# Patient Record
Sex: Female | Born: 1975 | Race: White | Hispanic: No | Marital: Married | Smoking: Former smoker
Health system: Southern US, Community
[De-identification: ages and names within clinical notes are randomized; demographics above are authoritative.]

## PROBLEM LIST (undated history)

## (undated) DIAGNOSIS — G43909 Migraine, unspecified, not intractable, without status migrainosus: Secondary | ICD-10-CM

## (undated) DIAGNOSIS — F53 Postpartum depression: Secondary | ICD-10-CM

## (undated) DIAGNOSIS — O99345 Other mental disorders complicating the puerperium: Secondary | ICD-10-CM

## (undated) DIAGNOSIS — N469 Male infertility, unspecified: Secondary | ICD-10-CM

## (undated) HISTORY — DX: Other mental disorders complicating the puerperium: O99.345

## (undated) HISTORY — PX: OTHER SURGICAL HISTORY: SHX169

## (undated) HISTORY — DX: Migraine, unspecified, not intractable, without status migrainosus: G43.909

## (undated) HISTORY — DX: Male infertility, unspecified: N46.9

## (undated) HISTORY — DX: Postpartum depression: F53.0

## (undated) HISTORY — PX: SKIN CANCER EXCISION: SHX779

---

## 1998-04-07 ENCOUNTER — Inpatient Hospital Stay (HOSPITAL_COMMUNITY): Admission: AD | Admit: 1998-04-07 | Discharge: 1998-04-07 | Payer: Self-pay | Admitting: Obstetrics

## 2001-11-18 ENCOUNTER — Emergency Department (HOSPITAL_COMMUNITY): Admission: EM | Admit: 2001-11-18 | Discharge: 2001-11-18 | Payer: Self-pay

## 2002-08-29 ENCOUNTER — Other Ambulatory Visit: Admission: RE | Admit: 2002-08-29 | Discharge: 2002-08-29 | Payer: Self-pay | Admitting: Obstetrics and Gynecology

## 2002-11-22 ENCOUNTER — Encounter: Payer: Self-pay | Admitting: Emergency Medicine

## 2002-11-22 ENCOUNTER — Emergency Department (HOSPITAL_COMMUNITY): Admission: EM | Admit: 2002-11-22 | Discharge: 2002-11-22 | Payer: Self-pay | Admitting: Emergency Medicine

## 2003-10-22 ENCOUNTER — Other Ambulatory Visit: Admission: RE | Admit: 2003-10-22 | Discharge: 2003-10-22 | Payer: Self-pay | Admitting: Obstetrics and Gynecology

## 2004-03-02 ENCOUNTER — Ambulatory Visit (HOSPITAL_COMMUNITY): Admission: RE | Admit: 2004-03-02 | Discharge: 2004-03-02 | Payer: Self-pay | Admitting: Obstetrics and Gynecology

## 2004-04-03 ENCOUNTER — Inpatient Hospital Stay (HOSPITAL_COMMUNITY): Admission: AD | Admit: 2004-04-03 | Discharge: 2004-04-03 | Payer: Self-pay | Admitting: Obstetrics and Gynecology

## 2004-05-26 ENCOUNTER — Ambulatory Visit: Payer: Self-pay | Admitting: Family Medicine

## 2004-09-07 ENCOUNTER — Ambulatory Visit: Payer: Self-pay | Admitting: Family Medicine

## 2004-10-19 ENCOUNTER — Ambulatory Visit: Payer: Self-pay | Admitting: Family Medicine

## 2004-11-07 ENCOUNTER — Ambulatory Visit: Payer: Self-pay | Admitting: Family Medicine

## 2005-06-13 ENCOUNTER — Encounter: Admission: RE | Admit: 2005-06-13 | Discharge: 2005-07-16 | Payer: Self-pay | Admitting: Obstetrics and Gynecology

## 2005-08-14 ENCOUNTER — Inpatient Hospital Stay (HOSPITAL_COMMUNITY): Admission: AD | Admit: 2005-08-14 | Discharge: 2005-08-17 | Payer: Self-pay | Admitting: Obstetrics and Gynecology

## 2005-09-18 ENCOUNTER — Ambulatory Visit: Payer: Self-pay | Admitting: Family Medicine

## 2005-10-17 ENCOUNTER — Ambulatory Visit: Payer: Self-pay | Admitting: Internal Medicine

## 2006-02-16 ENCOUNTER — Ambulatory Visit: Payer: Self-pay | Admitting: Family Medicine

## 2006-06-11 ENCOUNTER — Ambulatory Visit: Payer: Self-pay | Admitting: Family Medicine

## 2006-07-30 ENCOUNTER — Ambulatory Visit: Payer: Self-pay | Admitting: Family Medicine

## 2006-08-23 ENCOUNTER — Ambulatory Visit: Payer: Self-pay | Admitting: Family Medicine

## 2006-08-23 LAB — CONVERTED CEMR LAB
Basophils Absolute: 0.1 10*3/uL (ref 0.0–0.1)
Basophils Relative: 1.1 % — ABNORMAL HIGH (ref 0.0–1.0)
Cholesterol: 234 mg/dL (ref 0–200)
Direct LDL: 173.4 mg/dL
Glucose, Bld: 82 mg/dL (ref 70–99)
HDL: 63.8 mg/dL (ref >39.0)
Hemoglobin: 13.9 g/dL (ref 12.0–15.0)
Monocytes Relative: 4 % (ref 3.0–11.0)
Platelets: 299 10*3/uL (ref 150–400)
RBC: 4.58 M/uL (ref 3.87–5.11)
RDW: 12.5 % (ref 11.5–14.6)
TSH: 1.72 microintl units/mL (ref 0.35–5.50)
Total CHOL/HDL Ratio: 3.7
Triglycerides: 81 mg/dL (ref 0–149)
VLDL: 16 mg/dL (ref 0–40)

## 2006-11-14 ENCOUNTER — Ambulatory Visit: Payer: Self-pay | Admitting: Family Medicine

## 2007-04-22 ENCOUNTER — Ambulatory Visit: Payer: Self-pay | Admitting: Internal Medicine

## 2007-04-22 DIAGNOSIS — L2089 Other atopic dermatitis: Secondary | ICD-10-CM

## 2007-04-22 DIAGNOSIS — M75100 Unspecified rotator cuff tear or rupture of unspecified shoulder, not specified as traumatic: Secondary | ICD-10-CM

## 2007-12-18 ENCOUNTER — Ambulatory Visit (HOSPITAL_COMMUNITY): Admission: RE | Admit: 2007-12-18 | Discharge: 2007-12-18 | Payer: Self-pay | Admitting: Obstetrics and Gynecology

## 2007-12-19 ENCOUNTER — Inpatient Hospital Stay (HOSPITAL_COMMUNITY): Admission: AD | Admit: 2007-12-19 | Discharge: 2007-12-19 | Payer: Self-pay | Admitting: Obstetrics & Gynecology

## 2007-12-20 ENCOUNTER — Inpatient Hospital Stay (HOSPITAL_COMMUNITY): Admission: AD | Admit: 2007-12-20 | Discharge: 2007-12-20 | Payer: Self-pay | Admitting: Obstetrics & Gynecology

## 2007-12-20 ENCOUNTER — Encounter: Payer: Self-pay | Admitting: Obstetrics and Gynecology

## 2007-12-20 ENCOUNTER — Observation Stay (HOSPITAL_COMMUNITY): Admission: AD | Admit: 2007-12-20 | Discharge: 2007-12-21 | Payer: Self-pay | Admitting: Obstetrics and Gynecology

## 2007-12-24 ENCOUNTER — Encounter: Payer: Self-pay | Admitting: Obstetrics and Gynecology

## 2007-12-24 ENCOUNTER — Encounter (INDEPENDENT_AMBULATORY_CARE_PROVIDER_SITE_OTHER): Payer: Self-pay | Admitting: Obstetrics and Gynecology

## 2007-12-24 ENCOUNTER — Inpatient Hospital Stay (HOSPITAL_COMMUNITY): Admission: AD | Admit: 2007-12-24 | Discharge: 2007-12-30 | Payer: Self-pay | Admitting: Obstetrics and Gynecology

## 2008-01-10 ENCOUNTER — Inpatient Hospital Stay (HOSPITAL_COMMUNITY): Admission: AD | Admit: 2008-01-10 | Discharge: 2008-01-11 | Payer: Self-pay | Admitting: *Deleted

## 2008-05-27 ENCOUNTER — Ambulatory Visit: Payer: Self-pay | Admitting: Family Medicine

## 2008-08-07 ENCOUNTER — Ambulatory Visit: Payer: Self-pay | Admitting: Family Medicine

## 2008-08-07 DIAGNOSIS — IMO0002 Reserved for concepts with insufficient information to code with codable children: Secondary | ICD-10-CM

## 2008-11-30 ENCOUNTER — Ambulatory Visit: Payer: Self-pay | Admitting: Family Medicine

## 2008-11-30 DIAGNOSIS — M25519 Pain in unspecified shoulder: Secondary | ICD-10-CM | POA: Insufficient documentation

## 2008-11-30 DIAGNOSIS — M2550 Pain in unspecified joint: Secondary | ICD-10-CM | POA: Insufficient documentation

## 2008-12-18 ENCOUNTER — Ambulatory Visit: Payer: Self-pay | Admitting: Family Medicine

## 2008-12-18 DIAGNOSIS — M654 Radial styloid tenosynovitis [de Quervain]: Secondary | ICD-10-CM

## 2009-01-27 ENCOUNTER — Emergency Department (HOSPITAL_COMMUNITY): Admission: EM | Admit: 2009-01-27 | Discharge: 2009-01-27 | Payer: Self-pay | Admitting: Emergency Medicine

## 2009-04-14 ENCOUNTER — Ambulatory Visit: Payer: Self-pay | Admitting: Family Medicine

## 2009-04-25 ENCOUNTER — Emergency Department (HOSPITAL_COMMUNITY): Admission: EM | Admit: 2009-04-25 | Discharge: 2009-04-25 | Payer: Self-pay | Admitting: Emergency Medicine

## 2010-02-25 IMAGING — CR DG ANKLE COMPLETE 3+V*R*
3 series · 3 of 3 positions shown · non-contrast
Comparison: None

CLINICAL DATA: Ankle injury and pain.

RIGHT ANKLE - COMPLETE 3+ VIEW

[t ankle joint ap right]
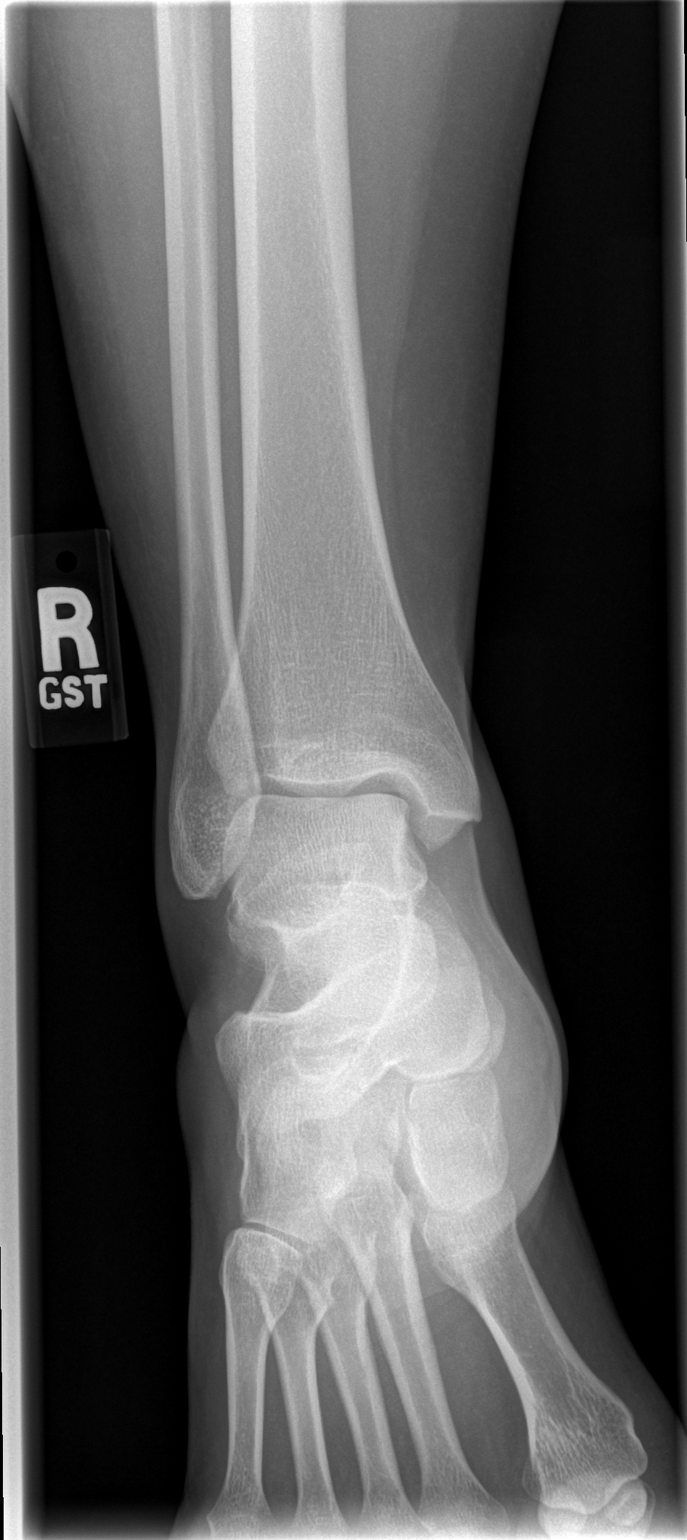

[t ankle joint oblique right]
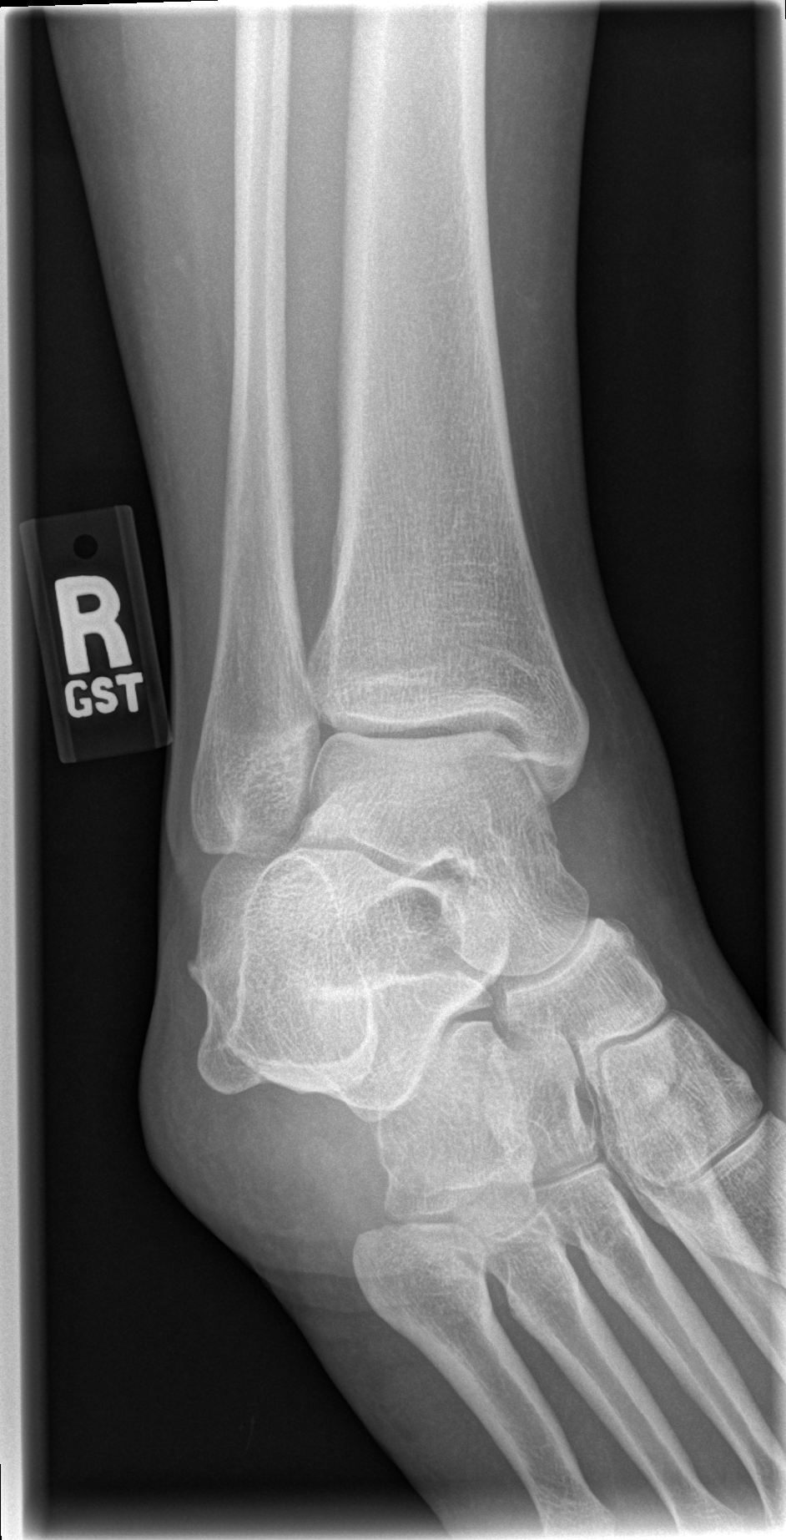

[t ankle joint lat right]
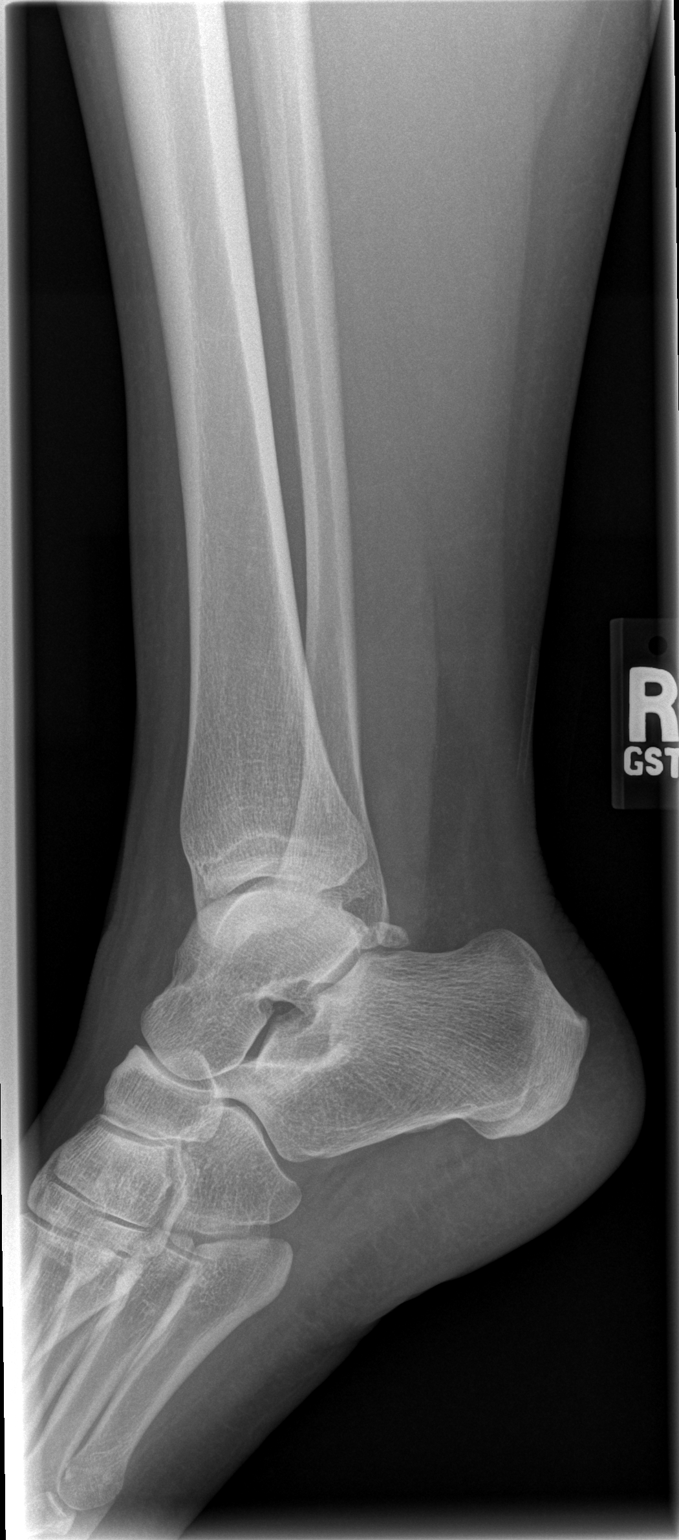

[3 of 3 positions shown; findings below may reference images not displayed]

FINDINGS: There is no evidence of fracture, dislocation or joint
effusion.  There is no evidence of arthropathy or other focal bone
abnormality.  Soft tissues are unremarkable.
IMPRESSION: Negative.

## 2010-08-07 ENCOUNTER — Encounter: Payer: Self-pay | Admitting: Obstetrics and Gynecology

## 2010-11-29 NOTE — Op Note (Signed)
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   PROCEDURE:  Primary low segment transverse cesarean section.   SURGEON:  Lenoard Aden, MD   ANESTHESIA:  Spinal by Malen Gauze.   ESTIMATED BLOOD LOSS:  1200 mL.   COMPLICATIONS:  None.   DRAINS:  Foley.   COUNTS:  Correct.   The patient to recovery in good condition.   FINDINGS:  A delivered as a female, B boy, C female, all babies to the  NICU.  Placenta to pathology, delivered manually, 2-layer uterine  closure.  Normal tubes and normal ovaries.   BRIEF OPERATIVE NOTE:  Being apprised of risks of anesthesia, infection,  bleeding, injury to abdominal organs, need for repair, delayed versus  immediate complications to include bowel and bladder injury, the patient  was brought to the operating room where she was administered spinal  anesthetic without complications, prepped and draped in usual sterile  fashion.  Foley catheter was placed.  After achieving adequate  anesthesia, dilute Marcaine solution placed.  Pfannenstiel skin incision  made with a scalpel, carried down to the fascia, which was nicked in the  midline and then transversely using Mayo scissors.  Rectus muscle was  dissected sharply in the midline.  Peritoneum entered sharply.  Bladder  blade placed.  Visceral peritoneum scoured sharply off the low-uterine  segment.  Kerr hysterotomy incision made.  Amniotomy clear fluid.  Baby  A  delivered from the maternal left footling breech position, handed to  pediatricians in attendance.  Apgars pending.  Amniotomy clear fluid.  Baby B is delivered from vertex position from the maternal right as a  boy, handed to pediatricians in attendance.  Apgars pending.  C  delivered also as a vertex amniotomy clear fluid, handed to  pediatricians in attendance.  Apgars pending.  Cord blood collected.  Placenta delivered manually, intact 3-vessel cord in all 3 cords.  Uterus was exteriorized, curetted using a dry lap pack and closed in 2  running imbricating layers of 0 Monocryl suture, multiple interrupted  sutures placed in the midline for hemostasis.  Irrigation accomplished.  Bladder flap inspected and found to be hemostatic.  Peritoneum closed using a 2-0 plain in a continuous running fashion.  Fascia closed using 0 Monocryl in a running fashion.  Subcutaneous  tissue reapproximated using a 2-0 plain in a continuous running fashion.  Skin closed using staples.  The patient tolerated the procedure well,  was transferred to recovery in good condition.Lenoard Aden, M.D.  Electronically Signed     RJT/MEDQ  D:  12/27/2007  T:  12/28/2007  Job:  [id number]

## 2010-11-29 NOTE — H&P (Signed)
Sherry Zamora, Sherry Zamora                ACCOUNT NO.:  [id number]   MEDICAL RECORD NO.:  [id number]          PATIENT TYPE:  INP   LOCATION:  9171                          FACILITY:  [facility]   PHYSICIAN:  Lenoard Aden, M.D.DATE OF BIRTH:  Dec 23, 1975   DATE OF ADMISSION:  12/24/2007  DATE OF DISCHARGE:                              HISTORY & PHYSICAL   CHIEF COMPLAINT:  Possible preeclampsia.   She is a 35 year old white female G2, P1 at 27-1/7 weeks with a triplet  gestation who is now status post amnio-reduction for twin-to-twin  transfusion sequence performed 5 days ago successfully.  Was seen in  maternal/fetal medicine for followup today with elevated blood pressures  and a 10-pound weight gain in a 5-day period.   MEDICATIONS:  Prenatal vitamin.   ALLERGIES:  HYDROCORTISONE.   PERSONAL HISTORY:  Of skin cancer, basal cell, and migraine headaches,  gestational diabetes with her first pregnancy, has passed her 1-hour  Glucola test done earlier this pregnancy.   She is a nonsmoker, nondrinker.  Denies domestic or physical violence.   FAMILY HISTORY:  Migraines, heart disease, bronchitis, lupus, CHF,  diabetes, and kidney disease.   PRENATAL HISTORY:  Remarkable for an uncomplicated vaginal delivery at  38 weeks in 2007.  Prenatal course to date has been notable for a  triplet pregnancy with an early twin-to-twin transfusion sequence of A  to B status post amnio-reduction as noted.  Preterm labor with no  evidence of cervical change.   PHYSICAL EXAM:  Blood pressure is ranging from 140-150 over 90-97.  HEENT:  Normal.  LUNGS:  Clear.  HEART:  Regular rate and rhythm.  ABDOMEN:  Soft, gravid, and nontender.  No guarding, rebound, or  tenderness noted.  DTRs 2+ with 2+ pretibial edema.  No clonus is noted.  PELVIC EXAM:  Cervix is flared, 3 cm long presenting part of the pelvis.  NEUROLOGIC:  Nonfocal.   NSTs are reassuring x3 for 27 weeks.  No decels noted, 10 x 10 accels  noted, occasionally irregular contractions appreciated.   LABS:  Reveal a uric acid of 7.9, a CBC notable of a platelet count of  110,000. LDH of 211, SGOT and PT of 32 and 20 respectively.  The patient  is betamethasone complete.   IMPRESSION:  1. A 27-1/7 week triplet gestation.  2. 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  3. New-onset probable preeclampsia.   PLAN:  Admit.  Serial monitoring.  Serial labs.  Bed rest.  Bathroom  privilege.  Strict I&Os and start 24 hour urine.      Lenoard Aden, M.D.  Electronically Signed     RJT/MEDQ  D:  12/24/2007  T:  12/24/2007  Job:  [id number]

## 2010-11-29 NOTE — H&P (Signed)
NAME:  Sherry Zamora, Sherry Zamora                ACCOUNT NO.:  [id number]   MEDICAL RECORD NO.:  [id number]          PATIENT TYPE:  OBV   LOCATION:  9172                          FACILITY:  [facility]   PHYSICIAN:  Lenoard Aden, M.D.DATE OF BIRTH:  06-25-1976   DATE OF ADMISSION:  12/20/2007  DATE OF DISCHARGE:                              HISTORY & PHYSICAL   .   CHIEF COMPLAINT:  Preterm labor.   She is a 32-year white female G2, P1 at 83 and 4/[redacted] weeks gestation,  status post IVF with known triplet gestation with early, stage twin-to-  twin transfusion syndrome, status post amnio-reduction by Maternal Fetal  Medicine, Dr. Eustaquio Boyden, today, uncomplicated at 1500 cc.  She was  admitted now for fetal monitoring and is noted to be contracting every 2-  4 minutes, and slightly uncomfortable upon presentation for further  care.   She is has no known drug allergies.   MEDICATIONS:  Prenatal vitamins.   She is a nonsmoker, nondrinker.  She denies domestic or physical  violence.  She has a personal history of migraine headaches, gestational  diabetes with her first pregnancy, with normal 1-hour GTT this pregnancy  and a history of reformed tobacco use.   FAMILY HISTORY:  Lupus, chronic bronchitis, CHF, kidney stones,  scleroderma, migraines, diabetes, rheumatoid arthritis, kidney disease  and myocardial infarction.   Prenatal course to date has been uncomplicated up to development of new  onset twin-to-twin transfusion as of Dec 11, 2007, status post a  consultation with MFM this past week.   PHYSICAL EXAMINATION:  She is a well-developed, well-nourished white  female in no acute distress.  Blood pressure is 140s/80s to 90.  HEENT:  Normal.  NECK:  Supple, full range of motion.  LUNGS:  Clear.  HEART:  Regular.  ABDOMEN:  Soft, gravid, nontender.  No CVA tenderness.  Cervix is closed, but flared, 3 cm long.  Presenting part is at -2.  EXTREMITIES:  No cords.  DTRs 2+ and some clonus.  NEUROLOGIC:  Intact.  Fetal heart tones are noted, A, B and C, as  documented, contractions every 2-4 minutes, now spacing out status post  Procardia.   IMPRESSION:  1. A 26 and 4/7 week triplet gestation.  2. Early twin-to-twin transfusions of A to B with A being the twin      with polyhydramnios and B with oligohydramnios.  3. Preterm labor.   PLAN:  Tocolytics.  Will give Procardia 10 mg p.o. now, repeat in 20  minutes x2.  If no response, will proceed with magnesium sulfate.  Betamethasone completed as of this morning.  Will monitor closely,  discharge pending response to tocolytic therapy.      Lenoard Aden, M.D.  Electronically Signed     RJT/MEDQ  D:  12/20/2007  T:  12/20/2007  Job:  [id number]

## 2010-12-02 NOTE — H&P (Signed)
NAMELAVANA, Sherry Zamora                ACCOUNT NO.:  [id number]   MEDICAL RECORD NO.:  [id number]          PATIENT TYPE:  INP   LOCATION:  9173                          [facility]ACILITY:  [facility]   PHYSICIAN:  Lenoard Aden, M.D.DATE OF BIRTH:  [birth year]   DATE OF PROCEDURE:  DATE OF DISCHARGE:                      STAT - MUST CHANGE TO CORRECT WORK TYPE   CHIEF COMPLAINT:  This patient has diabetes at 38 weeks, for induction.  History of a fetus with severe bilateral fetal renal pyelectasis.   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   ALLERGIES:  HYDROCORTISONE.   MEDICATIONS:  Prenatal vitamins.   FAMILY HISTORY:  Congestive heart failure, myocardial infarction, chronic  bronchitis, diabetes, urolithiasis, scleroderma, bladder stone, and breast  cancer.   PAST SURGICAL HISTORY:  1.  Wisdom tooth removal.  2.  Skin cancer.  3.  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   SOCIAL HISTORY:  She is a reformed smoker.  She denies domestic violence.   PHYSICAL EXAMINATION:  GENERAL:  Well-developed, well-nourished white female  in no acute distress.  HEENT: Normal.  LUNGS:  Clear.  HEART:  Regular rhythm.  ABDOMEN:  Soft, gravid, nontender.  PELVIC:  Cervix is closed, __________, firm, vertex -1.  EXTREMITIES:  No cords.  NEUROLOGIC:  Nonfocal.   IMPRESSION:  1.  Thirty-week intrauterine pregnancy.  2.  Gestational diabetes with stable glucose component.  3.  Severe bilateral fetal renal pyelectasis.   PLAN:  Proceed with cervical ripening induction, Cervidil placement and NST  reactive.      Lenoard Aden, M.D.  Electronically Signed     RJT/MEDQ  D:  08/14/2005  T:  08/15/2005  Job:  [id number]

## 2010-12-02 NOTE — Assessment & Plan Note (Signed)
[facility]                                 ON-CALL NOTE   JAMESA, TEDRICK                       MRN:          [id number]  DATE:11/08/2006                            DOB:          [birth year]    Ms. Arita Miss calls in today stating that she is having a migraine.  She  has a history of recurrent migraines.  She has used Zomig in the past.  She reports that she needs a refill if possible.  She reports this the  same type of headaches with no changes.  She has tried an anti-  inflammatory with no significant improvement today.   PLAN:  I went ahead and called in a prescription refill at her local  [facility].  I advised the patient to follow up if her symptoms do not  improve or obviously if they worsen.  She is to be seen urgently.  Patient expressed understanding.     Leanne Chang, M.D.  Electronically Signed    LA/MedQ  DD: 11/08/2006  DT: 11/09/2006  Job #: [id number]

## 2010-12-02 NOTE — Discharge Summary (Signed)
[name], Sherry Zamora                ACCOUNT NO.:  [id number]   MEDICAL RECORD NO.:  [id number]          PATIENT TYPE:  INP   LOCATION:  9316                          FACILITY:  [facility]   PHYSICIAN:  Lenoard Aden, M.D.DATE OF BIRTH:  1976/01/13   DATE OF ADMISSION:  12/24/2007  DATE OF DISCHARGE:  12/30/2007                               DISCHARGE SUMMARY   The patient was admitted for severe preeclampsia with triplets.  Underwent uncomplicated primary C-section without complications.  All  triplets went to the NICU at 27 plus weeks'.  Postoperative course was  uncomplicated and diuresed without difficulty.  Magnesium sulfate  tocolysis prophylaxis performed.  Discharged to home on postop day #3  with Percocet, Ambien, and iron.  Follow up in the office in 4-6 weeks.      Lenoard Aden, M.D.  Electronically Signed     RJT/MEDQ  D:  01/25/2008  T:  01/26/2008  Job:  [id number]

## 2011-01-04 ENCOUNTER — Other Ambulatory Visit: Payer: Self-pay | Admitting: Family Medicine

## 2011-01-05 MED ORDER — SERTRALINE HCL 100 MG PO TABS: 100.0000 mg | ORAL_TABLET | Freq: Every day | ORAL | Status: DC

## 2011-01-05 NOTE — Telephone Encounter (Signed)
Pt notified and notes that she had CPX at the health dept in August or Sept of 2011. Pt advised that we need those records. Pt notes that she will schedule appt for 2 weeks.

## 2011-01-05 NOTE — Telephone Encounter (Signed)
Ok for #30, 3 refills but please remind her she needs an OV

## 2011-01-05 NOTE — Telephone Encounter (Signed)
Pt has not been seen in office since 12/18/2008. Please advise.

## 2011-01-12 ENCOUNTER — Encounter: Payer: Self-pay | Admitting: Family Medicine

## 2011-01-16 ENCOUNTER — Ambulatory Visit (INDEPENDENT_AMBULATORY_CARE_PROVIDER_SITE_OTHER): Payer: Self-pay | Admitting: Family Medicine

## 2011-01-16 ENCOUNTER — Encounter: Payer: Self-pay | Admitting: Family Medicine

## 2011-01-16 DIAGNOSIS — G43829 Menstrual migraine, not intractable, without status migrainosus: Secondary | ICD-10-CM

## 2011-01-16 DIAGNOSIS — F341 Dysthymic disorder: Secondary | ICD-10-CM

## 2011-01-16 DIAGNOSIS — F418 Other specified anxiety disorders: Secondary | ICD-10-CM | POA: Insufficient documentation

## 2011-01-16 NOTE — Progress Notes (Signed)
  Subjective:    Patient ID: Sherry Zamora, female    DOB: 09/04/1975, 35 y.o.   MRN: [id number]  HPI Depression- was starting to have panic attacks.  Started 1.5 months ago.  Started Zoloft and sxs have improved.  Sleeping well.  Has to take Zoloft at night due to sleepiness.  Single mom to 3 kids, has new job, has to move to switch schools.  Has a new relationship.    Migraines- menstrual related.  Has taken imitrex but concerned about cost.     Review of Systems For ROS see HPI     Objective:   Physical Exam  Constitutional: She appears well-developed and well-nourished. No distress.  Psychiatric: She has a normal mood and affect. Her behavior is normal. Judgment and thought content normal.          Assessment & Plan:

## 2011-01-16 NOTE — Assessment & Plan Note (Signed)
Pt reports sxs have improved since starting Zoloft.  Encouraged her to continue.  Will follow.  Pt to call if dose adjustment needed.

## 2011-01-16 NOTE — Assessment & Plan Note (Signed)
Rather than starting Imitrex which is expensive, will prophylax against menstrual migraines by taking prescription strength Naproxen prior to and encompassing first few days of menses.  Reviewed supportive care and red flags that should prompt return.  Pt expressed understanding and is in agreement w/ plan.

## 2011-01-16 NOTE — Patient Instructions (Signed)
Follow up as needed Take the Zoloft nightly Take the Naproxen (2 tabs twice daily) the 2-3 days before your period and for the 1st 2-3 days of your period to prevent migraines Call with any questions or concerns Happy 4th of July!

## 2011-03-07 ENCOUNTER — Telehealth: Payer: Self-pay | Admitting: *Deleted

## 2011-03-07 MED ORDER — SERTRALINE HCL 100 MG PO TABS: 200.0000 mg | ORAL_TABLET | Freq: Every day | ORAL | Status: DC

## 2011-03-07 NOTE — Telephone Encounter (Signed)
Discuss with patient  

## 2011-03-07 NOTE — Telephone Encounter (Signed)
Can increase to 2 tabs daily (200mg  daily) this is max dose.  If this doesn't work we will discuss options.

## 2011-03-07 NOTE — Telephone Encounter (Signed)
Pt states that she has been on Zoloft 100 mg for about 2 month and has yet to see the different. Pt would like to know if dose can be increase or if med needs to be changed. Please advise

## 2011-03-07 NOTE — Telephone Encounter (Signed)
Left message to call office

## 2011-04-13 LAB — CBC
HCT: 25.5 — ABNORMAL LOW
HCT: 25.7 — ABNORMAL LOW
HCT: 26.3 — ABNORMAL LOW
HCT: 26.8 — ABNORMAL LOW
HCT: 27.2 — ABNORMAL LOW
HCT: 28.3 — ABNORMAL LOW
Hemoglobin: 8.6 — ABNORMAL LOW
Hemoglobin: 8.7 — ABNORMAL LOW
Hemoglobin: 9 — ABNORMAL LOW
Hemoglobin: 9.2 — ABNORMAL LOW
Hemoglobin: 9.4 — ABNORMAL LOW
Hemoglobin: 9.7 — ABNORMAL LOW
Hemoglobin: 8.1 — ABNORMAL LOW
MCHC: 33.8
MCHC: 33.8
MCHC: 34.2
MCHC: 34.3
MCHC: 34.4
MCV: 82.7
MCV: 83.3
MCV: 84
Platelets: 110 — ABNORMAL LOW
Platelets: 113 — ABNORMAL LOW
Platelets: 84 — ABNORMAL LOW
Platelets: 92 — ABNORMAL LOW
Platelets: 99 — ABNORMAL LOW
Platelets: 117 — ABNORMAL LOW
RBC: 3.06 — ABNORMAL LOW
RBC: 3.09 — ABNORMAL LOW
RBC: 3.3 — ABNORMAL LOW
RBC: 3.39 — ABNORMAL LOW
RBC: 3.43 — ABNORMAL LOW
RDW: 14.8
RDW: 15.1
RDW: 15.1
RDW: 15.1
RDW: 15.2
RDW: 15.3
RDW: 16 — ABNORMAL HIGH
WBC: 12.5 — ABNORMAL HIGH
WBC: 6.2
WBC: 6.6
WBC: 7.6
WBC: 8.5
WBC: 9.5

## 2011-04-13 LAB — LACTATE DEHYDROGENASE
LDH: 182
LDH: 185
LDH: 202
LDH: 270 — ABNORMAL HIGH

## 2011-04-13 LAB — COMPREHENSIVE METABOLIC PANEL
ALT: 17
ALT: 19
ALT: 20
ALT: 20
AST: 32
Albumin: 1.7 — ABNORMAL LOW
Albumin: 1.8 — ABNORMAL LOW
Albumin: 1.8 — ABNORMAL LOW
Albumin: 1.9 — ABNORMAL LOW
Alkaline Phosphatase: 77
Alkaline Phosphatase: 84
Alkaline Phosphatase: 86
Alkaline Phosphatase: 90
BUN: 4 — ABNORMAL LOW
BUN: 4 — ABNORMAL LOW
BUN: 5 — ABNORMAL LOW
BUN: 6
BUN: 6
CO2: 22
CO2: 23
CO2: 24
CO2: 24
Calcium: 7.1 — ABNORMAL LOW
Calcium: 7.6 — ABNORMAL LOW
Calcium: 7.8 — ABNORMAL LOW
Calcium: 8 — ABNORMAL LOW
Chloride: 108
Chloride: 109
Creatinine, Ser: 0.61
GFR calc Af Amer: >60
GFR calc non Af Amer: >60
GFR calc non Af Amer: >60
GFR calc non Af Amer: >60
Glucose, Bld: 161 — ABNORMAL HIGH
Glucose, Bld: 79
Glucose, Bld: 80
Glucose, Bld: 83
Glucose, Bld: 96
Potassium: 3.4 — ABNORMAL LOW
Potassium: 3.6
Potassium: 3.7
Potassium: 3.7
Potassium: 3.7
Sodium: 131 — ABNORMAL LOW
Sodium: 137
Sodium: 137
Total Bilirubin: 0.4
Total Bilirubin: 0.4
Total Protein: 4.4 — ABNORMAL LOW
Total Protein: 4.4 — ABNORMAL LOW
Total Protein: 4.6 — ABNORMAL LOW
Total Protein: 4.7 — ABNORMAL LOW
Total Protein: 4.7 — ABNORMAL LOW

## 2011-04-13 LAB — SAMPLE TO BLOOD BANK

## 2011-04-13 LAB — URIC ACID
Uric Acid, Serum: 7.8 — ABNORMAL HIGH
Uric Acid, Serum: 8 — ABNORMAL HIGH
Uric Acid, Serum: 8.1 — ABNORMAL HIGH
Uric Acid, Serum: 8.2 — ABNORMAL HIGH
Uric Acid, Serum: 8.3 — ABNORMAL HIGH

## 2011-04-13 LAB — CROSSMATCH
ABO/RH(D): A POS
Antibody Screen: NEGATIVE

## 2011-04-13 LAB — CREATININE CLEARANCE, URINE, 24 HOUR
Collection Interval-CRCL: 24
Creatinine Clearance: 112
Creatinine, 24H Ur: 1196
Urine Total Volume-CRCL: 1400

## 2011-04-13 LAB — PROTEIN, URINE, 24 HOUR
Collection Interval-UPROT: 24
Protein, 24H Urine: 2506 — ABNORMAL HIGH
Urine Total Volume-UPROT: 1400

## 2011-04-13 LAB — MAGNESIUM: Magnesium: 6.2

## 2011-04-13 LAB — RPR: RPR Ser Ql: NONREACTIVE

## 2012-01-12 ENCOUNTER — Telehealth: Payer: Self-pay | Admitting: *Deleted

## 2012-01-12 MED ORDER — ZOLMITRIPTAN 2.5 MG PO TBDP: 2.5000 mg | ORAL_TABLET | ORAL | Status: DC | PRN

## 2012-01-12 MED ORDER — PROMETHAZINE HCL 6.25 MG/5ML PO SYRP: 6.2500 mg | ORAL_SOLUTION | Freq: Four times a day (QID) | ORAL | Status: DC | PRN

## 2012-01-12 NOTE — Telephone Encounter (Signed)
Called pt to clarify what dosage of medication, pt notes the lowest dose of zomig per notes currently having a headache that turns into a migraine everyday since she has taken the depo shot, and noted years ago she had the same sxs and was told if the third time she has depo she may need to stop taking the depo this was the second time she has taken it but has been years in between, takes depo to assist with her headaches, pt notes she is taking 2-6 excedrine daily per headaches do rise to a degree of migrane after the day advances on, pt offered OV but wants to have a neuro referral to [facility] w [facility] per excerdrin is eating her stomach, [facility] uses [facility], noted chidlrens liquid phen

## 2012-01-12 NOTE — Telephone Encounter (Signed)
Pt left vm requesting a refill on zomig and her liquid phenergan per hard to swallow, that she has gotten in the past, advised she will come in for OV if she needs to, notes she uses [facility] pharmacy, last OV 01-16-11, noted neither of these medications have been prescribed for this pt in the past history in Epic nor in centricity, however pt has gotten married in time here as a pt and not sure if medication was in deleted chart, please advise

## 2012-01-12 NOTE — Telephone Encounter (Signed)
We don't have any record of these meds.  If she can tell me doses we'll send them.

## 2012-01-12 NOTE — Telephone Encounter (Signed)
Ok for Zomig 2.5mg , 1 tab prn migraine.  #15, 3 refills Ok for phenergan 6.25mg /63ml- 2 tsps Q6 prn for nausea, disp Cannot do referral w/out OV- pt can call and schedule appt on her own (this is a new referral policy)

## 2012-01-12 NOTE — Telephone Encounter (Signed)
rx sent to pharmacy by e-script Left message on home number to advise if pt wants to have a referral she will need to come in for an OV per policy, please call our office if she would like a referral per pt noted she did not want a referral unless advised to come in to our office and wanted to skip a step.

## 2012-01-17 NOTE — Telephone Encounter (Signed)
Left vm to advise if the pt is interested in having a referral set up she will need to have an OV with MD Tabori per new rule to have pt seen in office at least 6 months prior to referral, advised if she still wants the referral to please call office to advise

## 2012-06-15 LAB — HM PAP SMEAR: HM PAP: NORMAL

## 2012-07-25 ENCOUNTER — Emergency Department (HOSPITAL_COMMUNITY)
Admission: EM | Admit: 2012-07-25 | Discharge: 2012-07-26 | Disposition: A | Discharge status: Home / Self Care | Payer: Medicaid Other | Attending: Emergency Medicine | Admitting: Emergency Medicine

## 2012-07-25 DIAGNOSIS — Z87891 Personal history of nicotine dependence: Secondary | ICD-10-CM | POA: Insufficient documentation

## 2012-07-25 DIAGNOSIS — L259 Unspecified contact dermatitis, unspecified cause: Secondary | ICD-10-CM | POA: Insufficient documentation

## 2012-07-25 DIAGNOSIS — L309 Dermatitis, unspecified: Secondary | ICD-10-CM

## 2012-07-25 DIAGNOSIS — R21 Rash and other nonspecific skin eruption: Secondary | ICD-10-CM | POA: Insufficient documentation

## 2012-07-25 DIAGNOSIS — Z8659 Personal history of other mental and behavioral disorders: Secondary | ICD-10-CM | POA: Insufficient documentation

## 2012-07-25 NOTE — ED Notes (Signed)
Pt in c/o rash to right flank area since Tuesday, some noted to her back on her right side, noted pain with this today, pt in no distress, headaches over last few days also.

## 2012-07-25 NOTE — ED Provider Notes (Signed)
History  Scribed for Jaynie Crumble, PA-C/ Sunnie Nielsen, MD, the patient was seen in room WTR7/WTR7. This chart was scribed by Candelaria Stagers. The patient's care started at 11:31 PM   CSN: [id number]  Arrival date & time 07/25/12  2210   First MD Initiated Contact with Patient 07/25/12 2234      Chief Complaint  Patient presents with  . Rash     The history is provided by the patient. No language interpreter was used.   PIETRINA JAGODZINSKI is a 37 y.o. female who presents to the Emergency Department complaining of a rash to the right flank area that started two days ago and has gotten worse.  She denies itching, fever, chills.  She denies any new products, clothing, or detergents.  Nothing seems to make the sx better or worse.  She has applied cortisone cream to the area.   Past Medical History  Diagnosis Date  . Postpartum depression     No past surgical history on file.  No family history on file.  History  Substance Use Topics  . Smoking status: Former Games developer  . Smokeless tobacco: Not on file  . Alcohol Use: No    OB History    Grav Para Term Preterm Abortions TAB SAB Ect Mult Living                  Review of Systems  Constitutional: Negative for fever and chills.  Respiratory: Negative for shortness of breath.   Gastrointestinal: Negative for nausea and vomiting.  Skin: Positive for rash (to right flank).  Neurological: Negative for weakness.  All other systems reviewed and are negative.    Allergies  Bee venom and Sulfamethoxazole w-trimethoprim  Home Medications   Current Outpatient Rx  Name  Route  Sig  Dispense  Refill  . CETIRIZINE HCL 5 MG/5ML PO SYRP   Oral   Take 10 mg by mouth every morning.         . ADULT MULTIVITAMIN W/MINERALS CH   Oral   Take 1 tablet by mouth every morning.         Marland Kitchen ZOLMITRIPTAN 2.5 MG PO TBDP   Oral   Take 1 tablet (2.5 mg total) by mouth as needed for migraine.   15 tablet   3     BP 142/82  Pulse 68   Temp 97.8 F (36.6 C) (Oral)  Resp 20  SpO2 100%  Physical Exam  Nursing note and vitals reviewed. Constitutional: She is oriented to person, place, and time. She appears well-developed and well-nourished. No distress.  HENT:  Head: Normocephalic and atraumatic.  Eyes: EOM are normal.  Neck: Neck supple. No tracheal deviation present.  Cardiovascular: Normal rate and regular rhythm.   Pulmonary/Chest: Effort normal. No respiratory distress. She has no wheezes. She has no rales.  Musculoskeletal: Normal range of motion.  Neurological: She is alert and oriented to person, place, and time.  Skin: Skin is warm and dry. Rash noted.       Right flank rash extending from lower ribs to the pelvic bon and onto the back. Rash is erythematous,with small papules, weeping. No warmth.    Psychiatric: She has a normal mood and affect. Her behavior is normal.    ED Course  Procedures   DIAGNOSTIC STUDIES: Oxygen Saturation is 100% on room air, normal by my interpretation.    COORDINATION OF CARE:  11:40 PM Consult with Dr. Dierdre Highman.      1. Rash  2. Eczema       MDM  Pt with right flank rash, non tender. Weeping. Had Dr. Dierdre Highman look at it, suspect eczema which pt has hx of. Asked for oral steroids. Will give a short course of steroids. Topical hydrocortisone cream.    I personally performed the services described in this documentation, which was scribed in my presence. The recorded information has been reviewed and is accurate.         Lottie Mussel, PA 07/26/12 0140

## 2012-07-26 MED ORDER — PREDNISONE 10 MG PO TABS: ORAL_TABLET | ORAL | Status: DC

## 2012-07-26 NOTE — ED Provider Notes (Signed)
Medical screening examination/treatment/procedure(s) were performed by non-physician practitioner and as supervising physician I was immediately available for consultation/collaboration.   Flint Melter, MD 07/26/12 [phone]

## 2012-08-04 ENCOUNTER — Encounter (HOSPITAL_COMMUNITY): Payer: Self-pay | Admitting: Emergency Medicine

## 2012-08-04 ENCOUNTER — Emergency Department (HOSPITAL_COMMUNITY)
Admission: EM | Admit: 2012-08-04 | Discharge: 2012-08-04 | Disposition: A | Discharge status: Home / Self Care | Payer: Medicaid Other | Source: Home / Self Care | Attending: Emergency Medicine | Admitting: Emergency Medicine

## 2012-08-04 DIAGNOSIS — K090 Developmental odontogenic cysts: Secondary | ICD-10-CM

## 2012-08-04 DIAGNOSIS — L309 Dermatitis, unspecified: Secondary | ICD-10-CM

## 2012-08-04 MED ORDER — CEPHALEXIN 500 MG PO CAPS: 500.0000 mg | ORAL_CAPSULE | Freq: Three times a day (TID) | ORAL | Status: DC

## 2012-08-04 NOTE — ED Notes (Signed)
Pt c/o a knot that started out on the right side of her face on her cheek. Pt states that swelling started yesterday. Pt isn't experiencing any pain.   Pt has no other concerns.

## 2012-08-04 NOTE — ED Notes (Signed)
Waiting discharge papers 

## 2012-08-04 NOTE — ED Provider Notes (Signed)
Chief Complaint  Patient presents with  . Facial Swelling    swelling of right cheek. started out as a knot then the swelling started yesterday. no pain.     History of Present Illness:   Sherry Zamora is a 37 year old female who has had a three-month history of a pea-sized knot overlying her right cheek. Today it seems a little bit bigger than usual. It's not been tender. There is no erythema of the cheek, no swelling or heat. She denies any fever or chills. She hasn't had any earache, nasal congestion or drainage. She denies any toothache or intraoral lesions. There is no cervical lymphadenopathy.  Ruchy also has had a rash on her right flank and right abdomen. This came up about 10 days ago. She went to the emergency room at the time and was given some corticosteroid cream and pills. It seemed to get better but then it flared back up again last night. She tried a corticosteroid cream and this does seem to make it worse and then she washed off and just try some Vaseline which did seem to help. This mildly pruritic. She cannot think of anything that might be coming contact in that area.  Review of Systems:  Other than noted above, the patient denies any of the following symptoms: Systemic:  No fevers, chills, sweats, weight loss or gain, fatigue, or tiredness. Eye:  No redness, pain, discharge, itching, blurred vision, or diplopia. ENT:  No headache, nasal congestion, sneezing, itching, epistaxis, ear pain, congestion, decreased hearing, ringing in ears, vertigo, or tinnitus.  No oral lesions, sore throat, pain on swallowing, or hoarseness. Neck:  No mass, tenderness or adenopathy. Lungs:  No coughing, wheezing, or shortness of breath. Skin:  No rash or itching.  PMFSH:  Past medical history, family history, social history, meds, and allergies were reviewed.  Physical Exam:   Vital signs:  BP 120/83  Pulse 86  Temp 98.7 F (37.1 C) (Oral)  Resp 16  SpO2 98%  LMP 08/04/2012 General:   Alert and oriented.  In no distress.  Skin warm and dry. Eye:  PERRL, full EOMs, lids and conjunctiva normal.   ENT:  TMs and canals clear.  Nasal mucosa not congested and without drainage.  Mucous membranes moist, no oral lesions, normal dentition, pharynx clear.  No cranial or facial pain to palplation. There is a pea-sized mass overlying the right maxilla. This was nontender. Neck:  Supple, full ROM.  No adenopathy, tenderness or mass.  Thyroid normal. Lungs:  Breath sounds clear and equal bilaterally.  No wheezes, rales or rhonchi. Heart:  Rhythm regular, without extrasystoles.  No gallops or murmers. Skin:  There is a well-demarcated, erythematous rash on the right flank spreading to the right abdomen. This was erythematous and scaly.  Assessment:  The primary encounter diagnosis was Cyst, odontogenic, developmental. A diagnosis of Dermatitis was also pertinent to this visit.  The cyst on the face is probably an odontogenic cyst. It may be inflamed, but she was given some antibiotics. I'm not sure what the rash is nothing she'll need to go see a dermatologist to get an answer on that.  Plan:   1.  The following meds were prescribed:   New Prescriptions   CEPHALEXIN (KEFLEX) 500 MG CAPSULE    Take 1 capsule (500 mg total) by mouth 3 (three) times daily.   2.  The patient was instructed in symptomatic care and handouts were given. 3.  The patient was told to return  if becoming worse in any way, if no better in 3 or 4 days, and given some red flag symptoms that would indicate earlier return.  Follow up:  The patient was told to follow up with Dr. Christain Sacramento for the facial cyst and with Dr. Para Skeans for the rash.       Reuben Likes, MD 08/04/12 [phone]

## 2012-09-12 ENCOUNTER — Other Ambulatory Visit: Payer: Self-pay | Admitting: Family Medicine

## 2012-09-13 NOTE — Telephone Encounter (Signed)
Please advise on RF request.  Last OV:01-16-11.//AB/CMA

## 2012-11-29 ENCOUNTER — Encounter: Payer: Self-pay | Admitting: Family Medicine

## 2012-11-29 ENCOUNTER — Ambulatory Visit (INDEPENDENT_AMBULATORY_CARE_PROVIDER_SITE_OTHER): Payer: BC Managed Care – PPO | Admitting: Family Medicine

## 2012-11-29 VITALS — BP 90/70 | HR 100 | Temp 97.9°F | Ht 63.5 in | Wt 163.4 lb

## 2012-11-29 DIAGNOSIS — Z7689 Persons encountering health services in other specified circumstances: Secondary | ICD-10-CM | POA: Insufficient documentation

## 2012-11-29 DIAGNOSIS — R220 Localized swelling, mass and lump, head: Secondary | ICD-10-CM

## 2012-11-29 DIAGNOSIS — Z0000 Encounter for general adult medical examination without abnormal findings: Secondary | ICD-10-CM

## 2012-11-29 DIAGNOSIS — Z130 Encounter for screening for diseases of the blood and blood-forming organs and certain disorders involving the immune mechanism: Secondary | ICD-10-CM | POA: Insufficient documentation

## 2012-11-29 LAB — CBC WITH DIFFERENTIAL/PLATELET
Basophils Relative: 0.5 % (ref 0.0–3.0)
HCT: 40.3 % (ref 36.0–46.0)
Hemoglobin: 13.8 g/dL (ref 12.0–15.0)
Lymphocytes Relative: 20.2 % (ref 12.0–46.0)
Lymphs Abs: 1.7 10*3/uL (ref 0.7–4.0)
Monocytes Relative: 5.3 % (ref 3.0–12.0)
Neutro Abs: 6.1 10*3/uL (ref 1.4–7.7)
RBC: 4.56 Mil/uL (ref 3.87–5.11)

## 2012-11-29 LAB — LIPID PANEL
Cholesterol: 198 mg/dL (ref 0–200)
Total CHOL/HDL Ratio: 3

## 2012-11-29 LAB — BASIC METABOLIC PANEL
BUN: 6 mg/dL (ref 6–23)
CO2: 27 mEq/L (ref 19–32)
Chloride: 107 mEq/L (ref 96–112)
Creatinine, Ser: 0.8 mg/dL (ref 0.4–1.2)
Glucose, Bld: 77 mg/dL (ref 70–99)

## 2012-11-29 LAB — TSH: TSH: 0.64 u[IU]/mL (ref 0.35–5.50)

## 2012-11-29 LAB — HEPATIC FUNCTION PANEL
ALT: 12 U/L (ref 0–35)
Albumin: 4 g/dL (ref 3.5–5.2)
Total Protein: 6.8 g/dL (ref 6.0–8.3)

## 2012-11-29 MED ORDER — SUMATRIPTAN SUCCINATE 100 MG PO TABS: 100.0000 mg | ORAL_TABLET | ORAL | Status: DC | PRN

## 2012-11-29 MED ORDER — SUMATRIPTAN 20 MG/ACT NA SOLN: 1.0000 | NASAL | Status: DC | PRN

## 2012-11-29 NOTE — Patient Instructions (Addendum)
Follow up in 1 year or as needed Keep up the good work!  You look great! We'll notify you of your lab results and make any changes if needed At 1st sign of migraine, take the imitrex.  May repeat in 2 hrs if symptoms continue.  No more than 2 pills in 24 hrs. Use nasal spray if nauseated Call with any questions or concerns Have a great summer!!!

## 2012-11-29 NOTE — Progress Notes (Signed)
  Subjective:    Patient ID: Sherry Zamora, female    DOB: [birth year], 37 y.o.   MRN: [id number]  HPI CPE- UTD on pap.     Review of Systems Patient reports no vision/ hearing changes, adenopathy,fever, weight change,  persistant/recurrent hoarseness , swallowing issues, chest pain, palpitations, edema, persistant/recurrent cough, hemoptysis, dyspnea (rest/exertional/paroxysmal nocturnal), gastrointestinal bleeding (melena, rectal bleeding), abdominal pain, significant heartburn, bowel changes, GU symptoms (dysuria, hematuria, incontinence), Gyn symptoms (abnormal  bleeding, pain),  syncope, focal weakness, memory loss, numbness & tingling, skin/hair/nail changes, abnormal bruising or bleeding, anxiety, or depression.     Objective:   Physical Exam General Appearance:    Alert, cooperative, no distress, appears stated age  Head:    Normocephalic, without obvious abnormality, atraumatic  Eyes:    PERRL, conjunctiva/corneas clear, EOM's intact, fundi    benign, both eyes  Ears:    Normal TM's and external ear canals, both ears  Nose:   Nares normal, septum midline, mucosa normal, no drainage    or sinus tenderness FACE: 1cm firm, mobile mass in R nasolabial fold, no TTP  Throat:   Lips, mucosa, and tongue normal; teeth and gums normal  Neck:   Supple, symmetrical, trachea midline, no adenopathy;    Thyroid: no enlargement/tenderness/nodules  Back:     Symmetric, no curvature, ROM normal, no CVA tenderness  Lungs:     Clear to auscultation bilaterally, respirations unlabored  Chest Wall:    No tenderness or deformity   Heart:    Regular rate and rhythm, S1 and S2 normal, no murmur, rub   or gallop  Breast Exam:    Deferred to GYN  Abdomen:     Soft, non-tender, bowel sounds active all four quadrants,    no masses, no organomegaly  Genitalia:    Deferred to GYN  Rectal:    Extremities:   Extremities normal, atraumatic, no cyanosis or edema  Pulses:   2+ and symmetric all extremities   Skin:   Skin color, texture, turgor normal, no rashes or lesions  Lymph nodes:   Cervical, supraclavicular, and axillary nodes normal  Neurologic:   CNII-XII intact, normal strength, sensation and reflexes    throughout          Assessment & Plan:

## 2012-11-29 NOTE — Assessment & Plan Note (Signed)
Pt's PE WNL.  UTD on pap.  Check labs.  Anticipatory guidance provided.  

## 2012-11-29 NOTE — Assessment & Plan Note (Signed)
New.  Get CT to assess.

## 2012-12-03 ENCOUNTER — Encounter: Payer: Self-pay | Admitting: *Deleted

## 2012-12-04 ENCOUNTER — Encounter: Payer: Self-pay | Admitting: Family Medicine

## 2012-12-04 LAB — VITAMIN D 1,25 DIHYDROXY: Vitamin D 1, 25 (OH)2 Total: 44 pg/mL (ref 18–72)

## 2012-12-05 ENCOUNTER — Encounter: Payer: Self-pay | Admitting: General Practice

## 2012-12-12 ENCOUNTER — Telehealth: Payer: Self-pay | Admitting: Family Medicine

## 2012-12-12 NOTE — Telephone Encounter (Signed)
noted 

## 2012-12-12 NOTE — Telephone Encounter (Signed)
In reference to your Order for CT Maxillofacial, entered 11/29/12, patient has not been scheduled.  I have called patient multiple times, and left messages to return my call.  I also mailed patient a letter to call me about scheduling her CT.  As of today, patient will not respond.

## 2013-02-20 ENCOUNTER — Ambulatory Visit (INDEPENDENT_AMBULATORY_CARE_PROVIDER_SITE_OTHER): Payer: BC Managed Care – PPO | Admitting: Family Medicine

## 2013-02-20 ENCOUNTER — Encounter: Payer: Self-pay | Admitting: Family Medicine

## 2013-02-20 ENCOUNTER — Telehealth: Payer: Self-pay | Admitting: Family Medicine

## 2013-02-20 VITALS — BP 116/74 | HR 82 | Temp 98.4°F | Wt 161.2 lb

## 2013-02-20 DIAGNOSIS — N760 Acute vaginitis: Secondary | ICD-10-CM | POA: Insufficient documentation

## 2013-02-20 DIAGNOSIS — B379 Candidiasis, unspecified: Secondary | ICD-10-CM

## 2013-02-20 MED ORDER — FLUCONAZOLE 150 MG PO TABS: ORAL_TABLET | ORAL | Status: DC

## 2013-02-20 NOTE — Assessment & Plan Note (Signed)
Diflucan 150 mg #2 Wet mount done

## 2013-02-20 NOTE — Telephone Encounter (Signed)
Sherry Zamora is calling to request a prescription for Diflucan. Did not want to tell me what for. Would like it sent to the pharmacy today. Offered Sherry Zamora an appointment with another provider after she says that Dr. Beverely Low had never prescribed this medication before. She refused stating that she did not want to see anyone except Dr. Beverely Low. Please advise.

## 2013-02-20 NOTE — Telephone Encounter (Signed)
Spoke with the pt and scheduled her today to see Dr. Lorain Childes

## 2013-02-20 NOTE — Patient Instructions (Addendum)
Monilial Vaginitis  Vaginitis in a soreness, swelling and redness (inflammation) of the vagina and vulva. Monilial vaginitis is not a sexually transmitted infection.  CAUSES   Yeast vaginitis is caused by yeast (candida) that is normally found in your vagina. With a yeast infection, the candida has overgrown in number to a point that upsets the chemical balance.  SYMPTOMS   · White, thick vaginal discharge.  · Swelling, itching, redness and irritation of the vagina and possibly the lips of the vagina (vulva).  · Burning or painful urination.  · Painful intercourse.  DIAGNOSIS   Things that may contribute to monilial vaginitis are:  · Postmenopausal and virginal states.  · Pregnancy.  · Infections.  · Being tired, sick or stressed, especially if you had monilial vaginitis in the past.  · Diabetes. Good control will help lower the chance.  · Birth control pills.  · Tight fitting garments.  · Using bubble bath, feminine sprays, douches or deodorant tampons.  · Taking certain medications that kill germs (antibiotics).  · Sporadic recurrence can occur if you become ill.  TREATMENT   Your caregiver will give you medication.  · There are several kinds of anti monilial vaginal creams and suppositories specific for monilial vaginitis. For recurrent yeast infections, use a suppository or cream in the vagina 2 times a week, or as directed.  · Anti-monilial or steroid cream for the itching or irritation of the vulva may also be used. Get your caregiver's permission.  · Painting the vagina with methylene blue solution may help if the monilial cream does not work.  · Eating yogurt may help prevent monilial vaginitis.  HOME CARE INSTRUCTIONS   · Finish all medication as prescribed.  · Do not have sex until treatment is completed or after your caregiver tells you it is okay.  · Take warm sitz baths.  · Do not douche.  · Do not use tampons, especially scented ones.  · Wear cotton underwear.  · Avoid tight pants and panty  hose.  · Tell your sexual partner that you have a yeast infection. They should go to their caregiver if they have symptoms such as mild rash or itching.  · Your sexual partner should be treated as well if your infection is difficult to eliminate.  · Practice safer sex. Use condoms.  · Some vaginal medications cause latex condoms to fail. Vaginal medications that harm condoms are:  · Cleocin cream.  · Butoconazole (Femstat®).  · Terconazole (Terazol®) vaginal suppository.  · Miconazole (Monistat®) (may be purchased over the counter).  SEEK MEDICAL CARE IF:   · You have a temperature by mouth above 102° F (38.9° C).  · The infection is getting worse after 2 days of treatment.  · The infection is not getting better after 3 days of treatment.  · You develop blisters in or around your vagina.  · You develop vaginal bleeding, and it is not your menstrual period.  · You have pain when you urinate.  · You develop intestinal problems.  · You have pain with sexual intercourse.  Document Released: 04/12/2005 Document Revised: 09/25/2011 Document Reviewed: 12/25/2008  Exit[employer]® Patient Information ©2014 [employer].

## 2013-02-20 NOTE — Progress Notes (Signed)
  Subjective:    Patient ID: Sherry Zamora, female    DOB: [birth year], 37 y.o.   MRN: [id number]  HPI  Pt here c/o vaginal d/c -- thick white, no odor.  No other compalints.   Review of Systems As above    Objective:   Physical Exam  BP 116/74  Pulse 82  Temp(Src) 98.4 F (36.9 C) (Oral)  Wt 161 lb 3.2 oz (73.12 kg)  BMI 28.1 kg/m2  SpO2 98% General appearance: alert, cooperative, appears stated age and no distress Abdomen: soft, non-tender; bowel sounds normal; no masses,  no organomegaly Pelvic: cervix normal in appearance, no adnexal masses or tenderness, no cervical motion tenderness, rectovaginal septum normal, uterus normal size, shape, and consistency and + thick white discharge, no odor      Assessment & Plan:

## 2013-02-21 LAB — WET PREP BY MOLECULAR PROBE: Trichomonas vaginosis: NEGATIVE

## 2013-02-24 ENCOUNTER — Telehealth: Payer: Self-pay

## 2013-02-24 MED ORDER — TERCONAZOLE 0.4 % VA CREA: 1.0000 | TOPICAL_CREAM | Freq: Every day | VAGINAL | Status: DC

## 2013-02-24 NOTE — Telephone Encounter (Signed)
Sent Rx to pharmacy. LVM for patient of providers instructions.

## 2013-02-24 NOTE — Telephone Encounter (Signed)
terazol 7---  F/u ov if no better or gyn

## 2013-02-24 NOTE — Telephone Encounter (Signed)
Patient states that she has taken both of the diflucan and she is still having discharge. With the wet prep showing neg what do you recommend? OV was 02/20/2013

## 2013-04-09 ENCOUNTER — Telehealth: Payer: Self-pay | Admitting: Family Medicine

## 2013-04-09 ENCOUNTER — Encounter: Payer: Self-pay | Admitting: General Practice
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
## 2013-04-09 NOTE — Telephone Encounter (Signed)
Children require more sleep than that.  Inadequate sleep can cause problems w/ learning, attention span, behavioral problems.  It would be better for their physical and emotional health (as well as their socialization) to go to a school closer to their home.

## 2013-04-09 NOTE — Telephone Encounter (Signed)
Letters placed up front for pt. Sherry Zamora stated that she cannot comment on the guardianship of the children. Can only write the letters to contain medical information only.

## 2013-04-09 NOTE — Telephone Encounter (Signed)
Please call pt and let her know that today is very busy but I will try and contact her later.  Please clarify whether her current husband has 'put out the kids' or her ex has given up custody/rights.

## 2013-05-07 ENCOUNTER — Ambulatory Visit (INDEPENDENT_AMBULATORY_CARE_PROVIDER_SITE_OTHER): Payer: BC Managed Care – PPO

## 2013-05-07 DIAGNOSIS — Z23 Encounter for immunization: Secondary | ICD-10-CM

## 2013-06-04 ENCOUNTER — Encounter: Payer: Self-pay | Admitting: Family Medicine

## 2013-09-12 ENCOUNTER — Ambulatory Visit (INDEPENDENT_AMBULATORY_CARE_PROVIDER_SITE_OTHER): Payer: BC Managed Care – PPO | Admitting: Family Medicine

## 2013-09-12 ENCOUNTER — Encounter: Payer: Self-pay | Admitting: Family Medicine

## 2013-09-12 VITALS — BP 124/84 | HR 87 | Temp 98.4°F | Resp 16 | Wt 156.4 lb

## 2013-09-12 DIAGNOSIS — F418 Other specified anxiety disorders: Secondary | ICD-10-CM

## 2013-09-12 DIAGNOSIS — L659 Nonscarring hair loss, unspecified: Secondary | ICD-10-CM | POA: Insufficient documentation

## 2013-09-12 DIAGNOSIS — R5383 Other fatigue: Secondary | ICD-10-CM

## 2013-09-12 DIAGNOSIS — R5381 Other malaise: Secondary | ICD-10-CM | POA: Insufficient documentation

## 2013-09-12 DIAGNOSIS — F341 Dysthymic disorder: Secondary | ICD-10-CM

## 2013-09-12 DIAGNOSIS — R002 Palpitations: Secondary | ICD-10-CM | POA: Insufficient documentation

## 2013-09-12 DIAGNOSIS — R5382 Chronic fatigue, unspecified: Secondary | ICD-10-CM | POA: Insufficient documentation

## 2013-09-12 LAB — CBC WITH DIFFERENTIAL/PLATELET
BASOS ABS: 0 10*3/uL (ref 0.0–0.1)
Basophils Relative: 0.4 % (ref 0.0–3.0)
EOS ABS: 0.1 10*3/uL (ref 0.0–0.7)
Eosinophils Relative: 1.2 % (ref 0.0–5.0)
HCT: 36.4 % (ref 36.0–46.0)
HEMOGLOBIN: 11.7 g/dL — AB (ref 12.0–15.0)
LYMPHS ABS: 1.4 10*3/uL (ref 0.7–4.0)
LYMPHS PCT: 22.4 % (ref 12.0–46.0)
MCHC: 32.2 g/dL (ref 30.0–36.0)
MCV: 86.1 fl (ref 78.0–100.0)
Monocytes Absolute: 0.3 10*3/uL (ref 0.1–1.0)
Monocytes Relative: 5.2 % (ref 3.0–12.0)
NEUTROS ABS: 4.6 10*3/uL (ref 1.4–7.7)
Neutrophils Relative %: 70.8 % (ref 43.0–77.0)
Platelets: 231 10*3/uL (ref 150.0–400.0)
RBC: 4.23 Mil/uL (ref 3.87–5.11)
RDW: 15.7 % — AB (ref 11.5–14.6)
WBC: 6.4 10*3/uL (ref 4.5–10.5)

## 2013-09-12 LAB — BASIC METABOLIC PANEL
BUN: 9 mg/dL (ref 6–23)
CALCIUM: 8.9 mg/dL (ref 8.4–10.5)
CHLORIDE: 105 meq/L (ref 96–112)
CO2: 27 mEq/L (ref 19–32)
CREATININE: 0.6 mg/dL (ref 0.4–1.2)
GFR: 123.57 mL/min (ref >60.00)
Glucose, Bld: 82 mg/dL (ref 70–99)
Potassium: 3.7 mEq/L (ref 3.5–5.1)
Sodium: 138 mEq/L (ref 135–145)

## 2013-09-12 LAB — TSH: TSH: 0.94 u[IU]/mL (ref 0.35–5.50)

## 2013-09-12 NOTE — Progress Notes (Signed)
   Subjective:    Patient ID: Sherry Zamora, female    DOB: [birth year], 38 y.o.   MRN: [id number]  HPI 'something's going on w/ my heart'- sensation is upper, central chest.  'feels like it's jumping, beating kinda funny'.  sxs are intermittent.  Worse w/ stress.  No SOB.  sxs improve w/ cough or clearing throat.  sxs started 'a couple of months ago'.  Increased stress during this time frame.  No N/V.  + fatigue.  Hairdresser reports hair is thinning and encouraged her to get thyroid checked.   Review of Systems For ROS see HPI     Objective:   Physical Exam  Constitutional: She is oriented to person, place, and time. She appears well-developed and well-nourished. No distress.  HENT:  Head: Normocephalic and atraumatic.  Eyes: Conjunctivae and EOM are normal. Pupils are equal, round, and reactive to light.  Neck: Normal range of motion. Neck supple. No thyromegaly present.  Cardiovascular: Normal rate, regular rhythm, normal heart sounds and intact distal pulses.   No murmur heard. Pulmonary/Chest: Effort normal and breath sounds normal. No respiratory distress.  Abdominal: Soft. She exhibits no distension. There is no tenderness.  Musculoskeletal: She exhibits no edema.  Lymphadenopathy:    She has no cervical adenopathy.  Neurological: She is alert and oriented to person, place, and time.  Skin: Skin is warm and dry.  Psychiatric: She has a normal mood and affect. Her behavior is normal.          Assessment & Plan:

## 2013-09-12 NOTE — Patient Instructions (Signed)
We'll notify you of your lab results and determine the next steps If all labs are normal, we'll talk about adding a medicine for anxiety/depression Call with any questions or concerns Hang in there!!

## 2013-09-12 NOTE — Progress Notes (Signed)
Pre visit review using our clinic review tool, if applicable. No additional management support is needed unless otherwise documented below in the visit note. 

## 2013-09-14 NOTE — Assessment & Plan Note (Signed)
New to provider.  EKG WNL.  Check labs to r/o anemia, electrolyte disturbance, or thyroid problem.  Will follow.

## 2013-09-14 NOTE — Assessment & Plan Note (Signed)
Ongoing problem for pt.  Not currently on meds.  Discussed possible need to restart.  Will follow.

## 2013-09-14 NOTE — Assessment & Plan Note (Signed)
New to provider.  Suspect this is due to pt's high stress levels and possible physical manifestation of depression.  Check labs to r/o anemia, thyroid, or electrolyte disturbance.  Will follow.

## 2013-09-14 NOTE — Assessment & Plan Note (Signed)
New.  Pt w/o evidence of excessive hair loss.  Check thyroid levels.  Suspect this is due to the level of stress pt is under.  Will follow.

## 2013-09-15 ENCOUNTER — Encounter: Payer: Self-pay | Admitting: Family Medicine

## 2013-09-16 MED ORDER — FLUOXETINE HCL 20 MG PO TABS: 20.0000 mg | ORAL_TABLET | Freq: Every day | ORAL | Status: DC

## 2013-12-04 ENCOUNTER — Encounter (HOSPITAL_COMMUNITY): Payer: Self-pay | Admitting: Emergency Medicine

## 2013-12-04 ENCOUNTER — Emergency Department (HOSPITAL_COMMUNITY)
Admission: EM | Admit: 2013-12-04 | Discharge: 2013-12-04 | Disposition: A | Discharge status: Home / Self Care | Payer: BC Managed Care – PPO | Attending: Emergency Medicine | Admitting: Emergency Medicine

## 2013-12-04 DIAGNOSIS — Y92410 Unspecified street and highway as the place of occurrence of the external cause: Secondary | ICD-10-CM | POA: Insufficient documentation

## 2013-12-04 DIAGNOSIS — Z8679 Personal history of other diseases of the circulatory system: Secondary | ICD-10-CM | POA: Insufficient documentation

## 2013-12-04 DIAGNOSIS — Z87891 Personal history of nicotine dependence: Secondary | ICD-10-CM | POA: Insufficient documentation

## 2013-12-04 DIAGNOSIS — Z79899 Other long term (current) drug therapy: Secondary | ICD-10-CM | POA: Insufficient documentation

## 2013-12-04 DIAGNOSIS — S0093XA Contusion of unspecified part of head, initial encounter: Secondary | ICD-10-CM

## 2013-12-04 DIAGNOSIS — Y9389 Activity, other specified: Secondary | ICD-10-CM | POA: Insufficient documentation

## 2013-12-04 DIAGNOSIS — Z8659 Personal history of other mental and behavioral disorders: Secondary | ICD-10-CM | POA: Insufficient documentation

## 2013-12-04 DIAGNOSIS — S0003XA Contusion of scalp, initial encounter: Secondary | ICD-10-CM | POA: Insufficient documentation

## 2013-12-04 DIAGNOSIS — S0083XA Contusion of other part of head, initial encounter: Principal | ICD-10-CM | POA: Insufficient documentation

## 2013-12-04 DIAGNOSIS — S1093XA Contusion of unspecified part of neck, initial encounter: Principal | ICD-10-CM

## 2013-12-04 MED ORDER — SUMATRIPTAN SUCCINATE 100 MG PO TABS: 100.0000 mg | ORAL_TABLET | ORAL | Status: DC | PRN

## 2013-12-04 MED ORDER — IBUPROFEN 800 MG PO TABS: 800.0000 mg | ORAL_TABLET | Freq: Three times a day (TID) | ORAL | Status: DC

## 2013-12-04 MED ORDER — TRAMADOL-ACETAMINOPHEN 37.5-325 MG PO TABS: 1.0000 | ORAL_TABLET | Freq: Four times a day (QID) | ORAL | Status: DC | PRN
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
## 2013-12-04 NOTE — Discharge Instructions (Signed)
Take the prescribed medication as directed. Follow-up with your primary care physician as needed Return to the ED for new or worsening symptoms-- dizziness, blurred vision, confusion, changes in speech, unilateral weakness, difficulty walking, profuse nausea/vomiting, etc.

## 2013-12-05 NOTE — ED Provider Notes (Signed)
Medical screening examination/treatment/procedure(s) were performed by non-physician practitioner and as supervising physician I was immediately available for consultation/collaboration.   EKG Interpretation None        Glynn Octave, MD 12/05/13 [phone]

## 2014-03-11 ENCOUNTER — Other Ambulatory Visit: Payer: Self-pay | Admitting: Family Medicine

## 2014-03-11 NOTE — Telephone Encounter (Signed)
Med filled.  

## 2014-06-06 ENCOUNTER — Other Ambulatory Visit: Payer: Self-pay | Admitting: Family Medicine

## 2014-06-08 ENCOUNTER — Telehealth: Payer: Self-pay

## 2014-06-08 MED ORDER — SUMATRIPTAN SUCCINATE 100 MG PO TABS: ORAL_TABLET | ORAL | Status: DC

## 2014-06-08 NOTE — Telephone Encounter (Signed)
[facility]  Triage Call Report Triage Record Num: [id number] Operator: Deforest HoylesKimberly Morris Patient Name: Natasha BenceSusie Kibler Call Date & Time: 06/06/2014 9:47:08AM Patient Phone: [phone] PCP: Patient Gender: Female PCP Fax : Patient DOB: [birth year] Practice Name: [facility]  Reason for Call: Caller: Yamira/Patient; PCP: Sheliah Hatchabori, Katherine E.; CB#: [phone]; Call regarding Medication Issue; Medication(s): Imitrex was filled last month, pharmacy says there are no refills and she needs it today; Pt states she forgot to refill the med yesterday when the office was open. Has chronic migraines, always around her menstrual cycle and takes Imitrex routinely each month. States MD called it in last month and she thought it was for a 6 month supply, but pharmacy says she doesn't have a refill. Currently has a "normal" type migraine [facility] and Tylenol. States she has no other health problems. Triaged per Headache guideline, yes to "History of migraines and symptoms typical". To be seen within 2 weeks. Per policy, okay to call in enough Imitrex to last until next business day. Resource RN verified EMR details. Imitrex 100mg  1 tab Q 2 hours PRN migraine as directed #3 No refills called in to [facility]). Pt notified of medication called in and she is to call office on Monday to discuss need for prescription refills/appointment. Pt verbalized understanding of advice and call back parameters.  Protocol(s) Used: Headache Recommended Outcome per Protocol: See Provider within 2 Weeks Reason for Outcome: History of migraines AND symptoms typical Care Advice: Follow recommendations of provider given previously for similar symptoms. If no relief or symptoms recur, call provider. ~

## 2014-06-08 NOTE — Telephone Encounter (Signed)
Requesting refill on SUMAtriptan (IMITREX) 100 MG tablet--TAKE ONE TABLET BY MOUTH EVERY TWO HOURS AS NEEDED FOR MIGRAINE  Last filled:  03/11/14 Amt:  10 tablets, 0 refills Last OV:  09/12/13  Please advise.

## 2014-06-08 NOTE — Telephone Encounter (Signed)
Med filled and pt notified.  

## 2014-06-08 NOTE — Telephone Encounter (Signed)
Ok for Imitrex refill, #10, 3 refills

## 2014-06-15 ENCOUNTER — Encounter: Payer: Self-pay | Admitting: Family Medicine

## 2014-06-15 ENCOUNTER — Ambulatory Visit (INDEPENDENT_AMBULATORY_CARE_PROVIDER_SITE_OTHER): Payer: BC Managed Care – PPO | Admitting: Family Medicine

## 2014-06-15 VITALS — BP 122/80 | HR 93 | Temp 98.1°F | Resp 16 | Ht 63.0 in | Wt 169.4 lb

## 2014-06-15 DIAGNOSIS — Z23 Encounter for immunization: Secondary | ICD-10-CM

## 2014-06-15 DIAGNOSIS — Z0000 Encounter for general adult medical examination without abnormal findings: Secondary | ICD-10-CM

## 2014-06-15 LAB — CBC WITH DIFFERENTIAL/PLATELET
BASOS ABS: 0 10*3/uL (ref 0.0–0.1)
Basophils Relative: 0.5 % (ref 0.0–3.0)
EOS ABS: 0.1 10*3/uL (ref 0.0–0.7)
Eosinophils Relative: 1.9 % (ref 0.0–5.0)
HCT: 38.2 % (ref 36.0–46.0)
Hemoglobin: 12.3 g/dL (ref 12.0–15.0)
LYMPHS PCT: 23.8 % (ref 12.0–46.0)
Lymphs Abs: 1.3 10*3/uL (ref 0.7–4.0)
MCHC: 32.3 g/dL (ref 30.0–36.0)
MCV: 86.6 fl (ref 78.0–100.0)
MONO ABS: 0.2 10*3/uL (ref 0.1–1.0)
Monocytes Relative: 4.5 % (ref 3.0–12.0)
NEUTROS ABS: 3.7 10*3/uL (ref 1.4–7.7)
NEUTROS PCT: 69.3 % (ref 43.0–77.0)
Platelets: 255 10*3/uL (ref 150.0–400.0)
RBC: 4.41 Mil/uL (ref 3.87–5.11)
RDW: 14.3 % (ref 11.5–15.5)
WBC: 5.3 10*3/uL (ref 4.0–10.5)

## 2014-06-15 LAB — BASIC METABOLIC PANEL
BUN: 10 mg/dL (ref 6–23)
CHLORIDE: 105 meq/L (ref 96–112)
CO2: 21 meq/L (ref 19–32)
Calcium: 8.8 mg/dL (ref 8.4–10.5)
Creatinine, Ser: 0.7 mg/dL (ref 0.4–1.2)
GFR: 107.92 mL/min (ref >60.00)
GLUCOSE: 84 mg/dL (ref 70–99)
POTASSIUM: 4 meq/L (ref 3.5–5.1)
SODIUM: 137 meq/L (ref 135–145)

## 2014-06-15 LAB — LIPID PANEL
CHOLESTEROL: 229 mg/dL — AB (ref 0–200)
HDL: 69.6 mg/dL (ref >39.00)
LDL Cholesterol: 148 mg/dL — ABNORMAL HIGH (ref 0–99)
NonHDL: 159.4
Total CHOL/HDL Ratio: 3
Triglycerides: 56 mg/dL (ref 0.0–149.0)
VLDL: 11.2 mg/dL (ref 0.0–40.0)

## 2014-06-15 LAB — HEPATIC FUNCTION PANEL
ALK PHOS: 40 U/L (ref 39–117)
ALT: 16 U/L (ref 0–35)
AST: 18 U/L (ref 0–37)
Albumin: 4.3 g/dL (ref 3.5–5.2)
BILIRUBIN DIRECT: 0.1 mg/dL (ref 0.0–0.3)
TOTAL PROTEIN: 7.5 g/dL (ref 6.0–8.3)
Total Bilirubin: 0.4 mg/dL (ref 0.2–1.2)

## 2014-06-15 LAB — TSH: TSH: 1.82 u[IU]/mL (ref 0.35–4.50)

## 2014-06-15 NOTE — Patient Instructions (Signed)
Follow up in 1 year or as needed We'll notify you of your lab results and make any changes if needed Keep up the good work on healthy diet and regular exercise Call with any questions or concerns Happy Holidays!!!

## 2014-06-15 NOTE — Assessment & Plan Note (Signed)
Pt's PE WNL.  UTD on GYN.  Check labs.  Tdap updated.  Anticipatory guidance provided. 

## 2014-06-15 NOTE — Progress Notes (Signed)
   Subjective:    Patient ID: Sherry Zamora, female    DOB: 12/22/1975, 38 y.o.   MRN: [id number]  HPI CPE- UTD on GYN.  No concerns today.  Due for Tdap.   Review of Systems Patient reports no vision/ hearing changes, adenopathy,fever, weight change,  persistant/recurrent hoarseness , swallowing issues, chest pain, palpitations, edema, persistant/recurrent cough, hemoptysis, dyspnea (rest/exertional/paroxysmal nocturnal), gastrointestinal bleeding (melena, rectal bleeding), abdominal pain, significant heartburn, bowel changes, GU symptoms (dysuria, hematuria, incontinence), Gyn symptoms (abnormal  bleeding, pain),  syncope, focal weakness, memory loss, numbness & tingling, skin/hair/nail changes, abnormal bruising or bleeding, anxiety, or depression.     Objective:   Physical Exam General Appearance:    Alert, cooperative, no distress, appears stated age  Head:    Normocephalic, without obvious abnormality, atraumatic  Eyes:    PERRL, conjunctiva/corneas clear, EOM's intact, fundi    benign, both eyes  Ears:    Normal TM's and external ear canals, both ears  Nose:   Nares normal, septum midline, mucosa normal, no drainage    or sinus tenderness  Throat:   Lips, mucosa, and tongue normal; teeth and gums normal  Neck:   Supple, symmetrical, trachea midline, no adenopathy;    Thyroid: no enlargement/tenderness/nodules  Back:     Symmetric, no curvature, ROM normal, no CVA tenderness  Lungs:     Clear to auscultation bilaterally, respirations unlabored  Chest Wall:    No tenderness or deformity   Heart:    Regular rate and rhythm, S1 and S2 normal, no murmur, rub   or gallop  Breast Exam:    Deferred to GYN  Abdomen:     Soft, non-tender, bowel sounds active all four quadrants,    no masses, no organomegaly  Genitalia:    Deferred to GYN  Rectal:    Extremities:   Extremities normal, atraumatic, no cyanosis or edema  Pulses:   2+ and symmetric all extremities  Skin:   Skin color,  texture, turgor normal, no rashes or lesions  Lymph nodes:   Cervical, supraclavicular, and axillary nodes normal  Neurologic:   CNII-XII intact, normal strength, sensation and reflexes    throughout          Assessment & Plan:

## 2014-06-15 NOTE — Addendum Note (Signed)
Addended by: Lorene DyYLER, Kayleen Alig L on: 06/15/2014 11:03 AM   Modules accepted: Orders

## 2014-06-15 NOTE — Progress Notes (Signed)
Pre visit review using our clinic review tool, if applicable. No additional management support is needed unless otherwise documented below in the visit note. 

## 2014-10-10 ENCOUNTER — Encounter (HOSPITAL_BASED_OUTPATIENT_CLINIC_OR_DEPARTMENT_OTHER): Payer: Self-pay | Admitting: *Deleted

## 2014-10-10 ENCOUNTER — Emergency Department (HOSPITAL_BASED_OUTPATIENT_CLINIC_OR_DEPARTMENT_OTHER): Payer: BLUE CROSS/BLUE SHIELD

## 2014-10-10 ENCOUNTER — Emergency Department (HOSPITAL_BASED_OUTPATIENT_CLINIC_OR_DEPARTMENT_OTHER)
Admission: EM | Admit: 2014-10-10 | Discharge: 2014-10-10 | Disposition: A | Discharge status: Home / Self Care | Payer: BLUE CROSS/BLUE SHIELD | Attending: Emergency Medicine | Admitting: Emergency Medicine

## 2014-10-10 DIAGNOSIS — Y9389 Activity, other specified: Secondary | ICD-10-CM | POA: Diagnosis not present

## 2014-10-10 DIAGNOSIS — Z8659 Personal history of other mental and behavioral disorders: Secondary | ICD-10-CM | POA: Diagnosis not present

## 2014-10-10 DIAGNOSIS — G43909 Migraine, unspecified, not intractable, without status migrainosus: Secondary | ICD-10-CM | POA: Insufficient documentation

## 2014-10-10 DIAGNOSIS — Y998 Other external cause status: Secondary | ICD-10-CM | POA: Insufficient documentation

## 2014-10-10 DIAGNOSIS — T148XXA Other injury of unspecified body region, initial encounter: Secondary | ICD-10-CM

## 2014-10-10 DIAGNOSIS — Y288XXA Contact with other sharp object, undetermined intent, initial encounter: Secondary | ICD-10-CM | POA: Insufficient documentation

## 2014-10-10 DIAGNOSIS — S61200A Unspecified open wound of right index finger without damage to nail, initial encounter: Secondary | ICD-10-CM | POA: Insufficient documentation

## 2014-10-10 DIAGNOSIS — Y9289 Other specified places as the place of occurrence of the external cause: Secondary | ICD-10-CM | POA: Diagnosis not present

## 2014-10-10 DIAGNOSIS — Z87891 Personal history of nicotine dependence: Secondary | ICD-10-CM | POA: Diagnosis not present

## 2014-10-10 DIAGNOSIS — S61210A Laceration without foreign body of right index finger without damage to nail, initial encounter: Secondary | ICD-10-CM | POA: Diagnosis present

## 2014-10-10 DIAGNOSIS — Z79899 Other long term (current) drug therapy: Secondary | ICD-10-CM | POA: Insufficient documentation

## 2014-10-10 MED ORDER — GELATIN ABSORBABLE 12-7 MM EX MISC
CUTANEOUS | Status: AC
  Administered 2014-10-10: 1
  Filled 2014-10-10: qty 1

## 2014-10-10 NOTE — ED Provider Notes (Signed)
CSN: [id number]     Arrival date & time 10/10/14  0008 History   First MD Initiated Contact with Patient 10/10/14 0050     Chief Complaint  Patient presents with  . Laceration     (Consider location/radiation/quality/duration/timing/severity/associated sxs/prior Treatment) Patient is a 39 y.o. female presenting with skin laceration. The history is provided by the patient and the spouse.  Laceration Location:  Hand Hand laceration location:  R finger Depth:  Through dermis Quality: avulsion   Bleeding: controlled   Injury mechanism: mandolin  Pain details:    Quality:  Aching   Severity:  Mild   Timing:  Constant   Progression:  Unchanged Foreign body present:  No foreign bodies Relieved by:  Nothing Worsened by:  Nothing tried Ineffective treatments:  None tried Tetanus status:  Up to date   Past Medical History  Diagnosis Date  . Postpartum depression   . Migraine    Past Surgical History  Procedure Laterality Date  . Cesarean section     History reviewed. No pertinent family history. History  Substance Use Topics  . Smoking status: Former Games developermoker  . Smokeless tobacco: Not on file  . Alcohol Use: No   OB History    No data available     Review of Systems  Skin: Positive for wound.  All other systems reviewed and are negative.     Allergies  Bee venom and Sulfamethoxazole-trimethoprim  Home Medications   Prior to Admission medications   Medication Sig Start Date End Date Taking? Authorizing Provider  BIOTIN PO Take 5-6 tablets by mouth daily.     Historical Provider, MD  cetirizine HCl (ZYRTEC) 5 MG/5ML SYRP Take 10 mg by mouth daily as needed for allergies.    Historical Provider, MD  ibuprofen (ADVIL,MOTRIN) 800 MG tablet Take 1 tablet (800 mg total) by mouth 3 (three) times daily. 12/04/13   Garlon HatchetLisa M Sanders, PA-C  Multiple Vitamin (MULTIVITAMIN) tablet Take 1 tablet by mouth daily.    Historical Provider, MD  SUMAtriptan (IMITREX) 100 MG tablet TAKE  ONE TABLET BY MOUTH EVERY TWO HOURS AS NEEDED FOR MIGRAINE 06/08/14   Sheliah HatchKatherine E Tabori, MD  traMADol-acetaminophen (ULTRACET) 37.5-325 MG per tablet Take 1 tablet by mouth every 6 (six) hours as needed. 12/04/13   Garlon HatchetLisa M Sanders, PA-C   BP 142/80 mmHg  Pulse 98  Temp(Src) 98.4 F (36.9 C) (Oral)  Resp 16  Ht 5\' 3"  (1.6 m)  Wt 156 lb (70.761 kg)  BMI 27.64 kg/m2  SpO2 100%  LMP 09/24/2014 Physical Exam  Constitutional: She is oriented to person, place, and time. She appears well-developed and well-nourished.  HENT:  Head: Normocephalic and atraumatic.  Mouth/Throat: Oropharynx is clear and moist.  Eyes: Conjunctivae are normal. Pupils are equal, round, and reactive to light.  Neck: Normal range of motion. Neck supple.  Cardiovascular: Normal rate, regular rhythm and intact distal pulses.   Pulmonary/Chest: Effort normal and breath sounds normal. No respiratory distress. She has no wheezes. She has no rales.  Abdominal: Soft. Bowel sounds are normal. She exhibits no distension. There is no tenderness. There is no rebound.  Musculoskeletal: Normal range of motion.       Hands: Neurological: She is alert and oriented to person, place, and time.  Skin: Skin is warm and dry.  Psychiatric: She has a normal mood and affect.    ED Course  Procedures (including critical care time) Labs Review Labs Reviewed - No data to display  Imaging  Review Dg Finger Index Right  10/10/2014   CLINICAL DATA:  Cut right index finger with slicer, right index finger pain. Laceration.  EXAM: RIGHT INDEX FINGER 2+V  COMPARISON:  None.  FINDINGS: Soft tissue injury to the distal right index finger. No radiopaque foreign body. No associated fracture. The alignment and joint spaces are maintained.  IMPRESSION: Soft tissue injury to the distal index finger, no radiopaque foreign body or osseous abnormality.   Electronically Signed   By: Rubye Oaks M.D.   On: 10/10/2014 01:20     EKG  Interpretation None      MDM   Final diagnoses:  Avulsion of skin    Thrombin gel applied, wound already hemostatic.  Bulky dressing applied keep clean and dry.  Neosporin BID x 7 days once gel falls off. Strict return precautions given.  Return for fever, pus, redness swelling or any concerns.      Cy Blamer, MD 10/10/14 [phone]

## 2014-10-10 NOTE — ED Notes (Signed)
Cut pad of first finger rt hand w slicer,  Cont to slowly bleed

## 2014-10-10 NOTE — ED Notes (Signed)
Pt. Cut tip of R index finger on Mandoline.  Pt. Has bleeding on arrival and pain.

## 2014-12-30 ENCOUNTER — Telehealth: Payer: Self-pay | Admitting: Family Medicine

## 2014-12-30 MED ORDER — SUMATRIPTAN SUCCINATE 100 MG PO TABS: ORAL_TABLET | ORAL | Status: DC

## 2014-12-30 NOTE — Telephone Encounter (Signed)
Caller name: Suella Grove from [facility]  Call back number: [phone] Pharmacy: [facility]   Reason for call:  Pharmacy requesting a refill SUMAtriptan (IMITREX) 100 MG tablet

## 2014-12-30 NOTE — Telephone Encounter (Signed)
Med filled.  

## 2015-06-18 ENCOUNTER — Encounter: Payer: BLUE CROSS/BLUE SHIELD | Admitting: Family Medicine

## 2015-11-08 IMAGING — DX DG FINGER INDEX 2+V*R*
3 series · 3 of 3 positions shown · non-contrast
Comparison: None.

CLINICAL DATA: Cut right index finger with slicer, right index
finger pain. Laceration.

EXAM:
RIGHT INDEX FINGER 2+V

[finger ap]
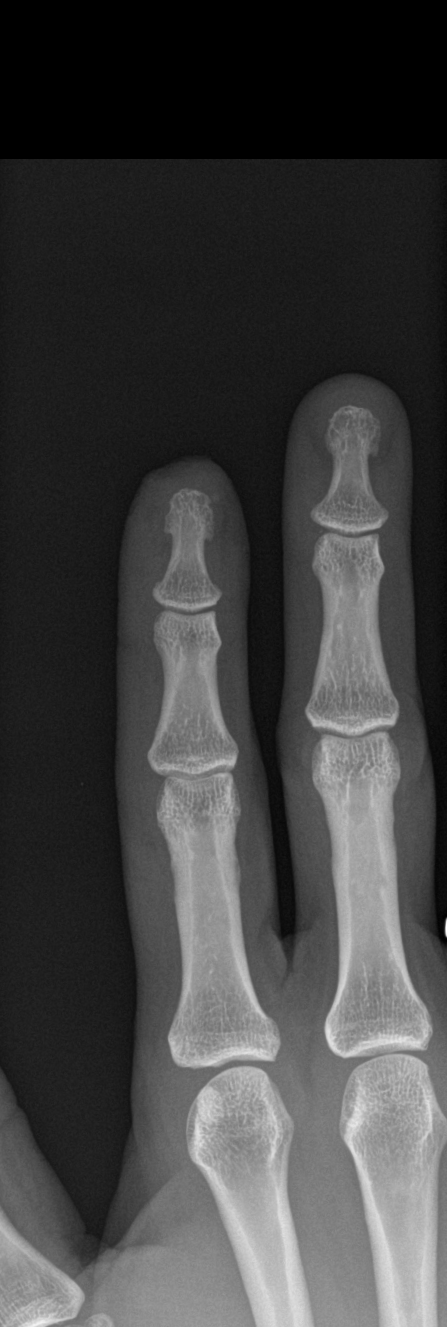

[finger obl]
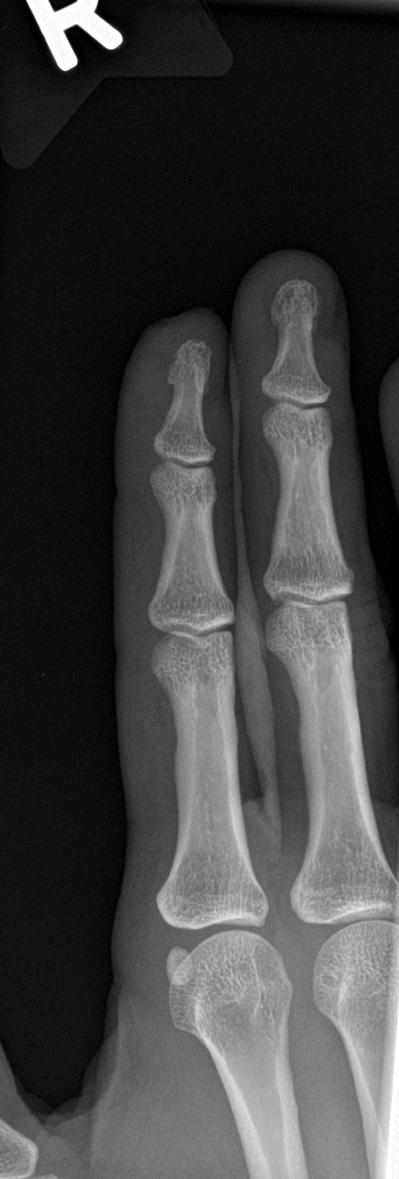

[finger lat]
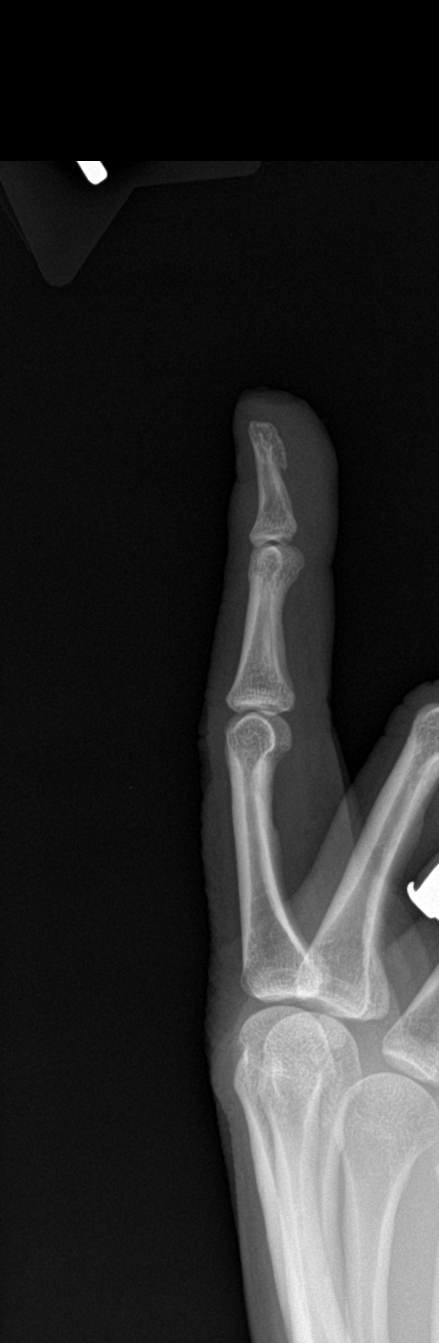

[3 of 3 positions shown; findings below may reference images not displayed]

FINDINGS: Soft tissue injury to the distal right index finger. No radiopaque
foreign body. No associated fracture. The alignment and joint spaces
are maintained.
IMPRESSION: Soft tissue injury to the distal index finger, no radiopaque foreign
body or osseous abnormality.

## 2022-10-18 ENCOUNTER — Ambulatory Visit: Payer: No Typology Code available for payment source | Admitting: Family Medicine

## 2022-10-18 ENCOUNTER — Encounter: Payer: Self-pay | Admitting: Family Medicine

## 2022-10-18 VITALS — BP 131/81 | HR 83 | Temp 97.8°F | Ht 63.0 in | Wt 173.1 lb

## 2022-10-18 DIAGNOSIS — L729 Follicular cyst of the skin and subcutaneous tissue, unspecified: Secondary | ICD-10-CM | POA: Diagnosis not present

## 2022-10-18 DIAGNOSIS — M542 Cervicalgia: Secondary | ICD-10-CM | POA: Diagnosis not present

## 2022-10-18 DIAGNOSIS — Z7689 Persons encountering health services in other specified circumstances: Secondary | ICD-10-CM

## 2022-10-18 DIAGNOSIS — G43119 Migraine with aura, intractable, without status migrainosus: Secondary | ICD-10-CM | POA: Diagnosis not present

## 2022-10-18 MED ORDER — SUMATRIPTAN SUCCINATE 100 MG PO TABS
ORAL_TABLET | ORAL | 1 refills | Status: DC
Start: 2022-10-18 — End: 2023-08-16

## 2022-10-18 MED ORDER — AJOVY 225 MG/1.5ML ~~LOC~~ SOAJ: 225.0000 mg | SUBCUTANEOUS | 6 refills | Status: DC

## 2022-10-18 NOTE — Progress Notes (Signed)
New Patient Office Visit  Subjective    Patient ID: Sherry Zamora, female    DOB: 01-Feb-1976  Age: 47 y.o. MRN: [id number]  CC:  Chief Complaint  Patient presents with   Establish Care   Cyst    Located on the top of head. She states that it is firm. She denies pain.    Neck Pain    Started last October.     HPI Sherry Zamora presents to establish care with this practice. She is new to me. She lives in [place].   History reviewed.  Last labs done 02/08/22, kidney function normal.   Wants to discuss:  Neck pain: Returned from [place] last October, 1-2 weeks after returning she was unable  to turn her head to right. Has been going to chiropractor. Continues to have muscle pain. Denies injury or unusual activities. Has tried TENS unit that has helped.   Cyst on top of head:  1 x 1 cm indurated area on top of head. No erythema, no warmth, no pain. Will watch. If it changes will send to derm.   Migraines: has been seen at [facility], that provider left.Has been using  Ajovy monthly injection. Migraines have been well controlled on Ajovy. Wishes for PCP to take over prescribing.  Sumatriptan 50 mg rarely (uses 100 mg) Doctor ready manufacturer.   Health maintenance: see GYN for pap, due to mammogram in May. Get CPE with GYN.   Outpatient Encounter Medications as of 10/18/2022  Medication Sig   cetirizine (ZYRTEC) 10 MG tablet Take 10 mg by mouth daily.   Multiple Vitamin (MULTIVITAMIN) tablet Take 1 tablet by mouth daily.   promethazine (PHENERGAN) 25 MG tablet Take by mouth.   Semaglutide-Weight Management 0.25 MG/0.5ML SOAJ Inject 0.25 mg into the skin.   [DISCONTINUED] Fremanezumab-vfrm (AJOVY) 225 MG/1.5ML SOAJ Inject into the skin.   [DISCONTINUED] SUMAtriptan (IMITREX) 100 MG tablet TAKE ONE TABLET BY MOUTH EVERY TWO HOURS AS NEEDED FOR MIGRAINE   BIOTIN PO Take 5-6 tablets by mouth daily.  (Patient not taking: Reported on 10/18/2022)   Fremanezumab-vfrm (AJOVY)  225 MG/1.5ML SOAJ Inject 225 mg into the skin every 30 (thirty) days. Inject into the skin.   SUMAtriptan (IMITREX) 100 MG tablet TAKE ONE TABLET BY MOUTH EVERY TWO HOURS AS NEEDED FOR MIGRAINE   [DISCONTINUED] cetirizine HCl (ZYRTEC) 5 MG/5ML SYRP Take 10 mg by mouth daily as needed for allergies. (Patient not taking: Reported on 10/18/2022)   [DISCONTINUED] ibuprofen (ADVIL,MOTRIN) 800 MG tablet Take 1 tablet (800 mg total) by mouth 3 (three) times daily.   [DISCONTINUED] traMADol-acetaminophen (ULTRACET) 37.5-325 MG per tablet Take 1 tablet by mouth every 6 (six) hours as needed.   No facility-administered encounter medications on file as of 10/18/2022.    Past Medical History:  Diagnosis Date   Migraine    Postpartum depression     Past Surgical History:  Procedure Laterality Date   CESAREAN SECTION      History reviewed. No pertinent family history.  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   Separated from husband 2009.   Social Determinants of Health   Financial Resource Strain: Not on file  Food Insecurity: Not on file  Transportation Needs: Not on file  Physical Activity: Not on file  Stress: Not on file  Social Connections: Not on file  Intimate Partner Violence: Not on file    Review of Systems  Constitutional:  Negative for chills and fever.  Eyes:  Negative for blurred vision and double vision.  Respiratory:  Negative for shortness of breath.    Cardiovascular:  Negative for chest pain.  Gastrointestinal:  Negative for nausea and vomiting.  Neurological:  Positive for headaches (history of migraines). Negative for dizziness.        Objective    BP 131/81   Pulse 83   Temp 97.8 F (36.6 C) (Oral)   Ht 5\' 3"  (1.6 m)   Wt 173 lb 1.6 oz (78.5 kg)   LMP 04/17/2022 (Approximate)   SpO2 99%   BMI 30.66 kg/m   Physical Exam Vitals and nursing note reviewed.  Constitutional:      General: She is not in acute distress.    Appearance: Normal appearance.  HENT:     Head:     Comments: 1 x 1 cm indurated area on top of head. No erythema, no warmth, no pain. Cardiovascular:     Rate and Rhythm: Regular rhythm.     Heart sounds: Normal heart sounds.  Pulmonary:     Effort: Pulmonary effort is normal.     Breath sounds: Normal breath sounds.  Musculoskeletal:        General: No swelling, tenderness, deformity or signs of injury.     Comments: Right side trapezius muscle without tenderness to palpation. Pain with turning head to right. No erythema or warmth, no rash.   Skin:    General: Skin is warm and dry.     Capillary Refill: Capillary refill takes less than 2 seconds.  Neurological:     General: No focal deficit present.     Mental Status: She is alert. Mental status is at baseline.  Psychiatric:        Mood and Affect: Mood normal.        Behavior: Behavior normal.        Thought Content: Thought content normal.        Judgment: Judgment normal.         Assessment & Plan:   Problem List Items Addressed This Visit     Establishing care with new doctor, encounter for - Primary   Neck pain on right side  Right side trapezius muscle without tenderness to palpation present for several months after trip.  Pain with turning head to right. No erythema or warmth, no rash.    Relevant Orders   Ambulatory referral to Orthopedics   Intractable migraine with aura without status migrainosus   Relevant  Medications  Current medications are effective. She is well controlled. Will monitor. If this changes, will refer to neurology. .    Fremanezumab-vfrm (AJOVY) 225 MG/1.5ML SOAJ   SUMAtriptan (IMITREX) 100 MG tablet   Scalp cyst  1 x 1 cm indurated area on top of head. No erythema, no warmth, no pain. Will watch for now.   Agrees with plan  of care discussed.  Questions answered.   Return in about 3 months (around 01/17/2023) for cpe with labs .   Chalmers Guest, FNP

## 2022-11-01 ENCOUNTER — Encounter: Payer: Self-pay | Admitting: Family Medicine

## 2022-11-02 ENCOUNTER — Other Ambulatory Visit: Payer: Self-pay | Admitting: Family Medicine

## 2022-11-02 DIAGNOSIS — B379 Candidiasis, unspecified: Secondary | ICD-10-CM

## 2022-11-02 MED ORDER — FLUCONAZOLE 150 MG PO TABS: ORAL_TABLET | ORAL | 0 refills | Status: DC

## 2022-11-02 NOTE — Progress Notes (Signed)
Recent antibiotic use. Diflucan 150 mg x 1 today, may repeat in 3 days if symptoms do not resolve.

## 2022-12-12 ENCOUNTER — Ambulatory Visit: Payer: No Typology Code available for payment source | Admitting: Family Medicine

## 2022-12-12 ENCOUNTER — Ambulatory Visit: Payer: No Typology Code available for payment source

## 2022-12-12 VITALS — BP 100/69 | HR 77 | Temp 97.9°F | Ht 63.0 in | Wt 169.0 lb

## 2022-12-12 DIAGNOSIS — M25522 Pain in left elbow: Secondary | ICD-10-CM | POA: Diagnosis not present

## 2022-12-12 MED ORDER — DICLOFENAC SODIUM 75 MG PO TBEC
75.0000 mg | DELAYED_RELEASE_TABLET | Freq: Two times a day (BID) | ORAL | 0 refills | Status: DC
Start: 2022-12-12 — End: 2023-03-09

## 2022-12-12 NOTE — Progress Notes (Signed)
Established Patient Office Visit  Subjective   Patient ID: Sherry Zamora, female    DOB: [birth year]  Age: 47 y.o. MRN: [id number]  Chief Complaint  Patient presents with   Elbow Pain    Left; Starting Friday or Saturday. She denies injury and states that it hurts more later in the day. She has taken Diclofenac (prescribed a while ago by another Provider) which makes it tolerable, but pain has not subsided. She also states that pain has taken her breath away at times.     HPI Presents today for an acute visit with complaint of left elbow pain describes as "its deep in there". Hurts to raise elbow to drive, pain is worse as day goes on. Noticed pain when pushing on chair. Denies unusual activity or falls.  Symptoms have been present  3-4 days.  Associated symptoms include: pain radiates down arm if she uses it, some soft tissue swelling.  Pertinent negatives: no numbness and tingling, no redness.   Pain severity: 6/10 at worse, 3/10 currently.  Treatments tried include : diclofenac oral  Treatment effective : helped some  Review of Systems  Constitutional:  Negative for chills and fever.  Respiratory:  Negative for shortness of breath.   Cardiovascular:  Negative for chest pain.  Musculoskeletal:  Positive for joint pain (left elbow). Negative for falls.      Objective:     BP 100/69   Pulse 77   Temp 97.9 F (36.6 C) (Oral)   Ht 5\' 3"  (1.6 m)   Wt 169 lb (76.7 kg)   SpO2 98%   BMI 29.94 kg/m  BP Readings from Last 3 Encounters:  12/12/22 100/69  10/18/22 131/81  10/10/14 142/80      Physical Exam Vitals and nursing note reviewed.  Constitutional:      General: She is not in acute distress.    Appearance: Normal appearance.  Pulmonary:     Effort: Pulmonary effort is normal.  Musculoskeletal:        General: Swelling and tenderness present. No deformity or signs of injury.     Comments: Left elbow: ROM intact. No erythema, some soft tissue swelling noted.  Circulation and sensation intact.   Skin:    General: Skin is warm and dry.     Capillary Refill: Capillary refill takes less than 2 seconds.  Neurological:     General: No focal deficit present.     Mental Status: She is alert. Mental status is at baseline.  Psychiatric:        Mood and Affect: Mood normal.        Behavior: Behavior normal.        Thought Content: Thought content normal.        Judgment: Judgment normal.     No results found for any visits on 12/12/22.    The ASCVD Risk score (Arnett DK, et al., 2019) failed to calculate for the following reasons:   Cannot find a previous HDL lab    Assessment & Plan:   Problem List Items Addressed This Visit   None Visit Diagnoses     Left elbow pain    -  Primary  ROM intact. No erythema, some soft tissue swelling noted. Tenderness in medial aspect of joint. No injury, no unusual activity.  Circulation and sensation intact. No rash. Will send for x-ray to rule out fracture/effusion. Recommend RICE and diclofenac 75 mg with food BID for pain. Next steps to be determined based on  x-ray results.    Relevant Medications   diclofenac (VOLTAREN) 75 MG EC tablet   Other Relevant Orders   DG Elbow Complete Left     Agrees with plan of care discussed.  Questions answered.   Return if symptoms worsen or fail to improve.    Novella Olive, FNP

## 2022-12-27 ENCOUNTER — Encounter: Payer: Self-pay | Admitting: Family Medicine

## 2022-12-28 ENCOUNTER — Other Ambulatory Visit: Payer: Self-pay | Admitting: Family Medicine

## 2022-12-28 DIAGNOSIS — L989 Disorder of the skin and subcutaneous tissue, unspecified: Secondary | ICD-10-CM | POA: Insufficient documentation

## 2023-01-22 ENCOUNTER — Encounter: Payer: Self-pay | Admitting: Family Medicine

## 2023-01-22 ENCOUNTER — Ambulatory Visit (INDEPENDENT_AMBULATORY_CARE_PROVIDER_SITE_OTHER): Payer: No Typology Code available for payment source | Admitting: Family Medicine

## 2023-01-22 VITALS — BP 127/82 | HR 64 | Temp 98.1°F | Ht 63.0 in | Wt 164.4 lb

## 2023-01-22 DIAGNOSIS — Z0000 Encounter for general adult medical examination without abnormal findings: Secondary | ICD-10-CM | POA: Diagnosis not present

## 2023-01-22 DIAGNOSIS — G43119 Migraine with aura, intractable, without status migrainosus: Secondary | ICD-10-CM | POA: Diagnosis not present

## 2023-01-22 DIAGNOSIS — Z1211 Encounter for screening for malignant neoplasm of colon: Secondary | ICD-10-CM

## 2023-01-22 MED ORDER — AJOVY 225 MG/1.5ML ~~LOC~~ SOAJ: 225.0000 mg | SUBCUTANEOUS | 6 refills | Status: DC

## 2023-01-22 NOTE — Progress Notes (Signed)
Complete physical exam  Patient: Sherry Zamora   DOB: 1976-05-12   47 y.o. Female  MRN: [id number]  Subjective:    Chief Complaint  Patient presents with   Annual Exam    Pt here for annual exam     Sherry Zamora is a 47 y.o. female who presents today for a complete physical exam. She reports consuming a  1100-1200 calorie per day low carb  diet. The patient does not participate in regular exercise at present. She generally feels well. She reports sleeping well. She does have additional problems to discuss today.   Migraines: needs refill for Ajovy which needs another PA. Migraines are well-controlled on this medication.   [facility] loss: [facility], labs done 16 weeks ago, will have sent to this office.   Pap smear: goes to GYN for this. Due this December.   Most recent fall risk assessment:    01/22/2023    8:32 AM  Fall Risk   Falls in the past year? 0  Number falls in past yr: 0  Injury with Fall? 0  Risk for fall due to : No Fall Risks  Follow up Falls evaluation completed     Most recent depression screenings:    01/22/2023    8:32 AM 10/18/2022    1:48 PM  PHQ 2/9 Scores  PHQ - 2 Score 0 0  PHQ- 9 Score  4  Exception Documentation Medical reason     Vision:Within last year and Dental: No current dental problems and Receives regular dental care    Patient Care Team: Novella Olive, FNP as PCP - General (Family Medicine)   Outpatient Medications Prior to Visit  Medication Sig   BIOTIN PO Take 5-6 tablets by mouth daily.   cetirizine (ZYRTEC) 10 MG tablet Take 10 mg by mouth daily.   diclofenac (VOLTAREN) 75 MG EC tablet Take 1 tablet (75 mg total) by mouth 2 (two) times daily.   fluconazole (DIFLUCAN) 150 MG tablet Take one tablet by mouth today, may repeat in 3 days if symptoms do not resolve.   Multiple Vitamin (MULTIVITAMIN) tablet Take 1 tablet by mouth daily.   promethazine (PHENERGAN) 25 MG tablet Take by mouth.    Semaglutide-Weight Management 0.25 MG/0.5ML SOAJ Inject 0.25 mg into the skin.   SUMAtriptan (IMITREX) 100 MG tablet TAKE ONE TABLET BY MOUTH EVERY TWO HOURS AS NEEDED FOR MIGRAINE   triamcinolone ointment (KENALOG) 0.1 % Apply topically 3 (three) times daily.   [DISCONTINUED] Fremanezumab-vfrm (AJOVY) 225 MG/1.5ML SOAJ Inject 225 mg into the skin every 30 (thirty) days. Inject into the skin.   No facility-administered medications prior to visit.    Review of Systems  Eyes:  Negative for blurred vision and double vision.  Respiratory:  Negative for shortness of breath.   Cardiovascular:  Negative for chest pain.  Gastrointestinal:  Negative for nausea and vomiting.  Neurological:  Positive for headaches (last migraine 7/4). Negative for dizziness.  Psychiatric/Behavioral:  Negative for depression and suicidal ideas.           Objective:     BP 127/82 (BP Location: Left Arm, Patient Position: Sitting, Cuff Size: Normal)   Pulse 64   Temp 98.1 F (36.7 C) (Oral)   Ht 5\' 3"  (1.6 m)   Wt 164 lb 6.4 oz (74.6 kg)   SpO2 100%   BMI 29.12 kg/m    Physical Exam Vitals and nursing note reviewed.  Constitutional:  General: She is not in acute distress.    Appearance: Normal appearance.  HENT:     Right Ear: Tympanic membrane normal.     Left Ear: Tympanic membrane normal.     Nose: Nose normal.     Mouth/Throat:     Mouth: Mucous membranes are moist.     Pharynx: Oropharynx is clear.  Eyes:     Extraocular Movements: Extraocular movements intact.  Cardiovascular:     Rate and Rhythm: Normal rate and regular rhythm.     Pulses: Normal pulses.     Heart sounds: Normal heart sounds.  Pulmonary:     Effort: Pulmonary effort is normal.     Breath sounds: Normal breath sounds.  Abdominal:     General: Bowel sounds are normal.     Palpations: Abdomen is soft.     Tenderness: There is no abdominal tenderness.  Musculoskeletal:     Right lower leg: No edema.     Left  lower leg: No edema.  Lymphadenopathy:     Cervical:     Right cervical: No superficial cervical adenopathy.    Left cervical: No superficial cervical adenopathy.  Skin:    General: Skin is warm and dry.     Capillary Refill: Capillary refill takes less than 2 seconds.  Neurological:     General: No focal deficit present.     Mental Status: She is alert. Mental status is at baseline.  Psychiatric:        Mood and Affect: Mood normal.        Behavior: Behavior normal.        Thought Content: Thought content normal.        Judgment: Judgment normal.      No results found for any visits on 01/22/23.     Assessment & Plan:    Routine Health Maintenance and Physical Exam  Immunization History  Administered Date(s) Administered   Influenza Whole 05/27/2008, 04/14/2009   Influenza,inj,Quad PF,6+ Mos 05/07/2013   PFIZER(Purple Top)SARS-COV-2 Vaccination 09/19/2019, 10/17/2019   Tdap 06/15/2014    Health Maintenance  Topic Date Due   HIV Screening  Never done   Hepatitis C Screening  Never done   PAP SMEAR-Modifier  06/16/2015   Colonoscopy  Never done   INFLUENZA VACCINE  02/15/2023   DTaP/Tdap/Td (2 - Td or Tdap) 06/15/2024   HPV VACCINES  Aged Out   COVID-19 Vaccine  Discontinued    Discussed health benefits of physical activity, and encouraged her to engage in regular exercise appropriate for her age and condition.  Problem List Items Addressed This Visit     Intractable migraine with aura without status migrainosus   Relevant Medications   Fremanezumab-vfrm (AJOVY) 225 MG/1.5ML SOAJ   Other Visit Diagnoses     Annual physical exam    -  Primary   Screening for colon cancer       Relevant Orders   Cologuard      HCM reviewed/discussed. Pap smear per GYN. Cologuard ordered.  Anticipatory guidance regarding healthy weight, lifestyle and choices given. Recommend healthy diet.  Recommend approximately 150 minutes/week of moderate intensity exercise. Resistance  training is good for building muscles and for bone health. Muscle mass helps to increase our metabolism and to burn more calories at rest.  Limit alcohol consumption: no more than one drink per day for women and 2 drinks per day for me. Recommend regular dental and vision exams. Always use seatbelt/lap and shoulder restraints. Recommend using smoke  alarms and checking batteries at least twice a year. Recommend using sunscreen when outside.  Please know that I am here to help you with all of your health care goals and am happy to work with you to find a solution that works best for you.  The greatest advice I have received with any changes in life are to take it one step at a time, that even means if all you can focus on is the next 60 seconds, then do that and celebrate your victories.  With any changes in life, you will have set backs, and that is OK. The important thing to remember is, if you have a set back, it is not a failure, it is an opportunity to try again! Agrees with plan of care discussed.  Questions answered.      Return in about 1 year (around 01/22/2024) for cpe .     Novella Olive, FNP

## 2023-02-15 ENCOUNTER — Encounter: Payer: Self-pay | Admitting: Family Medicine

## 2023-02-19 ENCOUNTER — Telehealth: Payer: Self-pay | Admitting: Family Medicine

## 2023-02-19 NOTE — Telephone Encounter (Signed)
Patient called in stating she needs a PA for Ajovy 225mg  . States she has been waiting for a PA for almost 2 months and has paid $815 each shot OOP and can't not continue to pay OOP for the medication. She also states that she has called multiple times for a PA for this medication and has gotten no response. She is due for the shot today, 02/19/23. Please Advise.

## 2023-02-20 ENCOUNTER — Telehealth: Payer: Self-pay

## 2023-02-20 NOTE — Telephone Encounter (Addendum)
Initiated Prior authorization WUJ:WJXBJ (fremanezumab-vfrm) injection 225MG /1.5ML auto-injectors Via: Covermymeds Case/Key:[id number] Status: approved  as of 02/20/23 Reason:Authorization Expiration Date: 02/19/2024  Notified Pt via: Mychart

## 2023-03-08 ENCOUNTER — Other Ambulatory Visit: Payer: Self-pay | Admitting: Family Medicine

## 2023-03-08 ENCOUNTER — Ambulatory Visit: Payer: No Typology Code available for payment source | Admitting: Dermatology

## 2023-03-08 DIAGNOSIS — M25522 Pain in left elbow: Secondary | ICD-10-CM

## 2023-04-02 ENCOUNTER — Other Ambulatory Visit (HOSPITAL_COMMUNITY)
Admission: RE | Admit: 2023-04-02 | Discharge: 2023-04-02 | Disposition: A | Discharge status: Home / Self Care | Payer: No Typology Code available for payment source | Source: Ambulatory Visit | Attending: Family Medicine | Admitting: Family Medicine

## 2023-04-02 ENCOUNTER — Encounter: Payer: Self-pay | Admitting: Family Medicine

## 2023-04-02 ENCOUNTER — Ambulatory Visit (INDEPENDENT_AMBULATORY_CARE_PROVIDER_SITE_OTHER): Payer: No Typology Code available for payment source | Admitting: Family Medicine

## 2023-04-02 VITALS — BP 122/81 | HR 75 | Resp 16 | Ht 63.0 in | Wt 154.0 lb

## 2023-04-02 DIAGNOSIS — Z01419 Encounter for gynecological examination (general) (routine) without abnormal findings: Secondary | ICD-10-CM | POA: Diagnosis present

## 2023-04-02 DIAGNOSIS — N951 Menopausal and female climacteric states: Secondary | ICD-10-CM | POA: Diagnosis not present

## 2023-04-02 MED ORDER — NORETHIN-ETH ESTRAD-FE BIPHAS 1 MG-10 MCG / 10 MCG PO TABS
1.0000 | ORAL_TABLET | Freq: Every day | ORAL | 5 refills | Status: DC
Start: 2023-04-02 — End: 2023-05-17

## 2023-04-02 NOTE — Progress Notes (Signed)
Pt gave birth to triplets preterm.  1 passed.  Last pap 12/19  NML Mammogram-6/24

## 2023-04-02 NOTE — Progress Notes (Signed)
Subjective:     Sherry Zamora is a 47 y.o. female and is here for a comprehensive physical exam. The patient reports problems - ? menopause . Cycles are wonky. Had one in 04/2022. Then in 01/2023. Has hot flashes, and having night sweats.  Uses fan. Reports wegovy use and minimal weight loss. Has foggy brain. Notes heart palpitations, weight gain. Has some difficulty concentrating. Notes some dry skin and worsening eczema. Has tried hormonal pellets, once, but did not do well with her skin. Has been told to go on low dose OCP's but cycles are so irregular, that she cannot figure out when to start them.    The following portions of the patient's history were reviewed and updated as appropriate: allergies, current medications, past family history, past medical history, past social history, past surgical history, and problem list.  Review of Systems Pertinent items noted in HPI and remainder of comprehensive ROS otherwise negative.   Objective:    BP 122/81   Pulse 75   Resp 16   Ht 5\' 3"  (1.6 m)   Wt 154 lb (69.9 kg)   LMP 01/26/2023   BMI 27.28 kg/m  General appearance: alert, cooperative, and appears stated age Head: Normocephalic, without obvious abnormality, atraumatic Neck: no adenopathy, supple, symmetrical, trachea midline, and thyroid not enlarged, symmetric, no tenderness/mass/nodules Lungs: clear to auscultation bilaterally Breasts: normal appearance, no masses or tenderness Heart: regular rate and rhythm, S1, S2 normal, no murmur, click, rub or gallop Abdomen: soft, non-tender; bowel sounds normal; no masses,  no organomegaly Pelvic: cervix normal in appearance, external genitalia normal, no adnexal masses or tenderness, no cervical motion tenderness, uterus normal size, shape, and consistency, and vagina normal without discharge Extremities: extremities normal, atraumatic, no cyanosis or edema Pulses: 2+ and symmetric Skin: Skin color, texture, turgor normal. No rashes or  lesions Lymph nodes: Cervical, supraclavicular, and axillary nodes normal. Neurologic: Grossly normal    Assessment:    Healthy female exam.      Plan:   Problem List Items Addressed This Visit       Unprioritized   Perimenopausal    99214 - given her on-going menses, will trial low dose OCPs. Advised of HRT risks and benefits.  Discussed bone loss, supplemental Ca and Vitamin D, heart health, breast cancer risk.  Also discussed changes related to menopause, normal progression and resolution of symptoms.  Advised to be on lowest dose possible for shortest amount of time.       Relevant Medications   Norethindrone-Ethinyl Estradiol-Fe Biphas (LO LOESTRIN FE) 1 MG-10 MCG / 10 MCG tablet   Other Visit Diagnoses     Encounter for gynecological examination without abnormal finding    -  Primary   Relevant Orders   Cytology - PAP( [place])      Return in 3 months (on 07/02/2023). Keep symptom diary   See After Visit Summary for Counseling Recommendations

## 2023-04-02 NOTE — Assessment & Plan Note (Addendum)
64403 - given her on-going menses, will trial low dose OCPs. Advised of HRT risks and benefits.  Discussed bone loss, supplemental Ca and Vitamin D, heart health, breast cancer risk.  Also discussed changes related to menopause, normal progression and resolution of symptoms.  Advised to be on lowest dose possible for shortest amount of time.

## 2023-04-05 LAB — CYTOLOGY - PAP
Comment: NEGATIVE
Diagnosis: NEGATIVE
High risk HPV: NEGATIVE

## 2023-04-10 ENCOUNTER — Ambulatory Visit (INDEPENDENT_AMBULATORY_CARE_PROVIDER_SITE_OTHER): Payer: No Typology Code available for payment source | Admitting: Family Medicine

## 2023-04-10 ENCOUNTER — Encounter: Payer: Self-pay | Admitting: Family Medicine

## 2023-04-10 VITALS — BP 115/77 | HR 60 | Temp 98.0°F | Resp 18 | Ht 63.0 in | Wt 156.3 lb

## 2023-04-10 DIAGNOSIS — F411 Generalized anxiety disorder: Secondary | ICD-10-CM | POA: Insufficient documentation

## 2023-04-10 DIAGNOSIS — R0789 Other chest pain: Secondary | ICD-10-CM | POA: Diagnosis not present

## 2023-04-10 DIAGNOSIS — F909 Attention-deficit hyperactivity disorder, unspecified type: Secondary | ICD-10-CM | POA: Insufficient documentation

## 2023-04-10 MED ORDER — BUPROPION HCL ER (XL) 150 MG PO TB24: 150.0000 mg | ORAL_TABLET | Freq: Every day | ORAL | 0 refills | Status: DC

## 2023-04-10 NOTE — Assessment & Plan Note (Signed)
    04/10/2023    4:33 PM 01/22/2023    8:33 AM 10/18/2022    1:48 PM  GAD 7 : Generalized Anxiety Score  Nervous, Anxious, on Edge 1 0 0  Control/stop worrying 1 0 0  Worry too much - different things 3 0 0  Trouble relaxing 3 0 1  Restless 3 0 0  Easily annoyed or irritable 1 0 0  Afraid - awful might happen 3 0 1  Total GAD 7 Score 15 0 2  Anxiety Difficulty  Not difficult at all Not difficult at all    GAD-7 score has greatly increased since July. She has started a new job and is caring for a loved one. Will start Wellbutrin XL 150 mg daily. Follow-up in 3 weeks to assess effectiveness. Declines talk therapy today.

## 2023-04-10 NOTE — Assessment & Plan Note (Signed)
EKG shows NSR. No ST elevation. Symptoms present for 2 years. Feels related to increased stress.

## 2023-04-10 NOTE — Progress Notes (Signed)
Established Patient Office Visit  Subjective   Patient ID: Sherry Zamora, female    DOB: February 26, 1976  Age: 47 y.o. MRN: [id number]  Chief Complaint  Patient presents with   Chest Pain    Patient is here due to the feeling of heavy pressure on her chest    HPI  Presents today to discuss chest pressure in right upper chest that she is able to pin point area  that has been present for 2 years. Getting worse with increased stress, Comes and goes after things settle down. Denies shortness of breath and episodes of diaphoresis. Has started a new job and the stress level has increased and she has been caring for loved one which has caused stress.  EKG: NSR, no ST elevation.  Review of Systems  Constitutional:  Negative for diaphoresis.  Respiratory:  Negative for shortness of breath.   Cardiovascular:  Positive for chest pain. Negative for palpitations and leg swelling.  Psychiatric/Behavioral:  Negative for depression and suicidal ideas. The patient is nervous/anxious.       Objective:     BP 115/77   Pulse 60   Temp 98 F (36.7 C) (Oral)   Resp 18   Ht 5\' 3"  (1.6 m)   Wt 156 lb 4.8 oz (70.9 kg)   LMP 01/26/2023   SpO2 99%   BMI 27.69 kg/m  BP Readings from Last 3 Encounters:  04/10/23 115/77  04/02/23 122/81  01/22/23 127/82      Physical Exam Vitals and nursing note reviewed.  Constitutional:      General: She is not in acute distress.    Appearance: She is well-developed and normal weight.  Cardiovascular:     Rate and Rhythm: Normal rate and regular rhythm.     Heart sounds: Normal heart sounds, S1 normal and S2 normal.  Pulmonary:     Effort: Pulmonary effort is normal.     Breath sounds: Normal breath sounds.  Skin:    General: Skin is warm and dry.  Neurological:     General: No focal deficit present.     Mental Status: She is alert. Mental status is at baseline.  Psychiatric:        Mood and Affect: Mood normal.        Behavior: Behavior normal.         Thought Content: Thought content normal.        Judgment: Judgment normal.     No results found for any visits on 04/10/23.    The ASCVD Risk score (Arnett DK, et al., 2019) failed to calculate for the following reasons:   Cannot find a previous HDL lab    Assessment & Plan:   Problem List Items Addressed This Visit     Pressure in right side of chest - Primary    EKG shows NSR. No ST elevation. Symptoms present for 2 years. Feels related to increased stress.       Relevant Orders   EKG 12-Lead   GAD (generalized anxiety disorder)       04/10/2023    4:33 PM 01/22/2023    8:33 AM 10/18/2022    1:48 PM  GAD 7 : Generalized Anxiety Score  Nervous, Anxious, on Edge 1 0 0  Control/stop worrying 1 0 0  Worry too much - different things 3 0 0  Trouble relaxing 3 0 1  Restless 3 0 0  Easily annoyed or irritable 1 0 0  Afraid - awful might  happen 3 0 1  Total GAD 7 Score 15 0 2  Anxiety Difficulty  Not difficult at all Not difficult at all    GAD-7 score has greatly increased since July. She has started a new job and is caring for a loved one. Will start Wellbutrin XL 150 mg daily. Follow-up in 3 weeks to assess effectiveness. Declines talk therapy today.      Relevant Medications   buPROPion (WELLBUTRIN XL) 150 MG 24 hr tablet   Attention deficit hyperactivity disorder (ADHD)    Not interested in stimulant medication. Will start Wellbutrin XL 150 mg daily. Declines referral for talk therapy. Uses meditation apps and talks with a friend.       Relevant Medications   buPROPion (WELLBUTRIN XL) 150 MG 24 hr tablet  Agrees with plan of care discussed.  Questions answered.   Return in about 3 weeks (around 05/01/2023) for gad medication management.    Novella Olive, FNP

## 2023-04-10 NOTE — Assessment & Plan Note (Signed)
Not interested in stimulant medication. Will start Wellbutrin XL 150 mg daily. Declines referral for talk therapy. Uses meditation apps and talks with a friend.

## 2023-04-15 ENCOUNTER — Encounter: Payer: Self-pay | Admitting: Family Medicine

## 2023-04-16 ENCOUNTER — Other Ambulatory Visit: Payer: Self-pay | Admitting: Family Medicine

## 2023-04-16 ENCOUNTER — Other Ambulatory Visit: Payer: Self-pay

## 2023-04-16 DIAGNOSIS — R0789 Other chest pain: Secondary | ICD-10-CM

## 2023-04-30 ENCOUNTER — Ambulatory Visit: Payer: No Typology Code available for payment source | Admitting: Family Medicine

## 2023-04-30 ENCOUNTER — Ambulatory Visit: Payer: No Typology Code available for payment source

## 2023-04-30 VITALS — BP 134/85 | HR 77 | Temp 98.1°F | Resp 16 | Ht 63.0 in | Wt 158.0 lb

## 2023-04-30 DIAGNOSIS — S6991XA Unspecified injury of right wrist, hand and finger(s), initial encounter: Secondary | ICD-10-CM

## 2023-04-30 DIAGNOSIS — S0993XA Unspecified injury of face, initial encounter: Secondary | ICD-10-CM | POA: Diagnosis not present

## 2023-04-30 DIAGNOSIS — F411 Generalized anxiety disorder: Secondary | ICD-10-CM

## 2023-04-30 DIAGNOSIS — W19XXXA Unspecified fall, initial encounter: Secondary | ICD-10-CM | POA: Diagnosis not present

## 2023-04-30 NOTE — Progress Notes (Signed)
Established Patient Office Visit  Subjective   Patient ID: Sherry Zamora, female    DOB: 1975-10-30  Age: 47 y.o. MRN: [id number]  Chief Complaint  Patient presents with   Medical Management of Chronic Issues    Mood  fup, pt has skipped doses due to vacation and  had a fall on vacation rt eye bruising  fall happened on tuedsay    HPI  Follow-up for GAD. Taking Wellbutrin XL 150 mg daily, forgot to take it on vacation. Took for 10 days and then missed one week. Started back on Wellbutrin today. Wants to continue.  Fell on vacation:  bruised right side of face and right hand. ROM intact to right hand, grip strength strong. The fall occurred on Tuesday. No headache. No photophobia, No LOC. Landed on right eye brow and cheek. Ecchymosis present.   Review of Systems  Eyes:  Negative for blurred vision, double vision, photophobia and pain.  Neurological:  Negative for sensory change, speech change, loss of consciousness, weakness and headaches.  Psychiatric/Behavioral:  Negative for suicidal ideas. The patient is nervous/anxious.       Objective:     BP 134/85   Pulse 77   Temp 98.1 F (36.7 C) (Oral)   Resp 16   Ht 5\' 3"  (1.6 m)   Wt 158 lb (71.7 kg)   LMP 01/26/2023   SpO2 99%   BMI 27.99 kg/m  BP Readings from Last 3 Encounters:  04/30/23 134/85  04/10/23 115/77  04/02/23 122/81      Physical Exam Vitals and nursing note reviewed.  Constitutional:      General: She is not in acute distress.    Appearance: Normal appearance.  HENT:     Head: Normocephalic.     Comments: Right side of face with ecchymosis and some swelling.  Cardiovascular:     Rate and Rhythm: Regular rhythm.  Pulmonary:     Effort: Pulmonary effort is normal.     Breath sounds: Normal breath sounds.  Musculoskeletal:     Right hand: Swelling present. No deformity, lacerations or bony tenderness. Normal range of motion. Normal strength. Normal sensation. Normal capillary refill.      Comments: Right hand with swelling. ROM intact. No bony tenderness with palpation.   Skin:    General: Skin is warm and dry.  Neurological:     General: No focal deficit present.     Mental Status: She is alert. Mental status is at baseline.  Psychiatric:        Mood and Affect: Mood normal.        Behavior: Behavior normal.        Thought Content: Thought content normal.        Judgment: Judgment normal.      No results found for any visits on 04/30/23.    The ASCVD Risk score (Arnett DK, et al., 2019) failed to calculate for the following reasons:   Cannot find a previous HDL lab    Assessment & Plan:   Problem List Items Addressed This Visit     GAD (generalized anxiety disorder) - Primary    Took Wellbutrin XL 150 mg for 10 days before going on vacation and forgetting to take medication with her. Describes some symptoms of abrupt discontinuation. Missed one week of medication, restarted today. She will continue medication and follow-up around 10/31 to assess effectiveness. No refill needed today.  GAd-7 score has improved. Will assess again at follow-up.  04/30/2023    4:28 PM 04/10/2023    4:33 PM 01/22/2023    8:33 AM 10/18/2022    1:48 PM  GAD 7 : Generalized Anxiety Score  Nervous, Anxious, on Edge 2 1 0 0  Control/stop worrying 1 1 0 0  Worry too much - different things 1 3 0 0  Trouble relaxing 1 3 0 1  Restless 1 3 0 0  Easily annoyed or irritable 1 1 0 0  Afraid - awful might happen 2 3 0 1  Total GAD 7 Score 9 15 0 2  Anxiety Difficulty   Not difficult at all Not difficult at all          Fall   Relevant Orders   DG Hand Complete Right   Hand injury, right, initial encounter    Fell while on vacation, landed on right side of face and right hand. Some swelling noted. ROM intact, grip strength strong. Will get x-ray due to swelling in joints. She will go to radiology after this visit.       Relevant Orders   DG Hand Complete Right   Facial  trauma, initial encounter    Larey Seat while on vacation landed on right side of face. Denies LOC, no headaches, no photophobia, no eye pain, no vision changes, no nausea or vomiting.  No indication for x-ray today. No signs of brain trauma. She is 6 days out from injury.      Agrees with plan of care discussed.  Questions answered.   Return in about 17 days (around 05/17/2023) for GAD .    Novella Olive, FNP

## 2023-04-30 NOTE — Assessment & Plan Note (Addendum)
Fell while on vacation, landed on right side of face and right hand. Some swelling noted. ROM intact, grip strength strong. Will get x-ray due to swelling in joints. She will go to radiology after this visit.

## 2023-04-30 NOTE — Assessment & Plan Note (Addendum)
Took Wellbutrin XL 150 mg for 10 days before going on vacation and forgetting to take medication with her. Describes some symptoms of abrupt discontinuation. Missed one week of medication, restarted today. She will continue medication and follow-up around 10/31 to assess effectiveness. No refill needed today.  GAd-7 score has improved. Will assess again at follow-up.      04/30/2023    4:28 PM 04/10/2023    4:33 PM 01/22/2023    8:33 AM 10/18/2022    1:48 PM  GAD 7 : Generalized Anxiety Score  Nervous, Anxious, on Edge 2 1 0 0  Control/stop worrying 1 1 0 0  Worry too much - different things 1 3 0 0  Trouble relaxing 1 3 0 1  Restless 1 3 0 0  Easily annoyed or irritable 1 1 0 0  Afraid - awful might happen 2 3 0 1  Total GAD 7 Score 9 15 0 2  Anxiety Difficulty   Not difficult at all Not difficult at all

## 2023-04-30 NOTE — Patient Instructions (Signed)
[facility]  [address]  The radiology department is on the first floor which is best accessed by going around to the back of the building. No appointment necessary. You can go at your convenience.

## 2023-04-30 NOTE — Assessment & Plan Note (Addendum)
Fell while on vacation landed on right side of face. Denies LOC, no headaches, no photophobia, no eye pain, no vision changes, no nausea or vomiting.  No indication for x-ray today. No signs of brain trauma. She is 6 days out from injury.

## 2023-05-01 ENCOUNTER — Ambulatory Visit: Payer: No Typology Code available for payment source | Admitting: Family Medicine

## 2023-05-06 ENCOUNTER — Other Ambulatory Visit: Payer: Self-pay | Admitting: Family Medicine

## 2023-05-06 DIAGNOSIS — F909 Attention-deficit hyperactivity disorder, unspecified type: Secondary | ICD-10-CM

## 2023-05-06 DIAGNOSIS — F411 Generalized anxiety disorder: Secondary | ICD-10-CM

## 2023-05-08 ENCOUNTER — Encounter: Payer: Self-pay | Admitting: Family Medicine

## 2023-05-17 ENCOUNTER — Encounter: Payer: Self-pay | Admitting: Family Medicine

## 2023-05-17 ENCOUNTER — Ambulatory Visit: Payer: No Typology Code available for payment source | Admitting: Family Medicine

## 2023-05-17 DIAGNOSIS — F411 Generalized anxiety disorder: Secondary | ICD-10-CM

## 2023-05-17 DIAGNOSIS — F909 Attention-deficit hyperactivity disorder, unspecified type: Secondary | ICD-10-CM | POA: Diagnosis not present

## 2023-05-17 MED ORDER — BUPROPION HCL ER (XL) 150 MG PO TB24: 150.0000 mg | ORAL_TABLET | Freq: Every day | ORAL | 1 refills | Status: DC

## 2023-05-17 NOTE — Assessment & Plan Note (Signed)
GAD and ADHD symptoms have improved. GAD-7 score much improved. Will continue Wellbutrin XL 150 mg daily. Refill sent. Follow-up in 6 months, sooner if needed.

## 2023-05-17 NOTE — Progress Notes (Signed)
   Established Patient Office Visit  Subjective   Patient ID: Sherry Zamora, female    DOB: [birth year]  Age: 47 y.o. MRN: [id number]  Chief Complaint  Patient presents with   Anxiety    HPI  Presents to follow-up on anxiety and medication management. Started Wellbutrin XL 150 mg daily on 9/26. Missed one week when on vacation. Reports symptoms have improved. I am focusing more most of the time. Anxiousness has improved. No thoughts of self harm.  Wants to continue taking Wellbutrin XL 150 mg daily.   Review of Systems  Psychiatric/Behavioral:  Negative for suicidal ideas.        Anxiety controlled.       Objective:     BP 105/70   Pulse 70   Temp 98.3 F (36.8 C) (Oral)   Resp 18   Ht 5\' 3"  (1.6 m)   Wt 160 lb 12.8 oz (72.9 kg)   SpO2 99%   BMI 28.48 kg/m  BP Readings from Last 3 Encounters:  05/17/23 105/70  04/30/23 134/85  04/10/23 115/77      Physical Exam Vitals and nursing note reviewed.  Constitutional:      General: She is not in acute distress.    Appearance: Normal appearance.  Pulmonary:     Effort: Pulmonary effort is normal.  Skin:    General: Skin is warm and dry.  Neurological:     General: No focal deficit present.     Mental Status: She is alert. Mental status is at baseline.  Psychiatric:        Mood and Affect: Mood normal.        Behavior: Behavior normal.        Thought Content: Thought content normal.        Judgment: Judgment normal.       05/17/2023    4:16 PM 04/30/2023    4:28 PM 04/10/2023    4:33 PM 01/22/2023    8:33 AM  GAD 7 : Generalized Anxiety Score  Nervous, Anxious, on Edge 0 2 1 0  Control/stop worrying 0 1 1 0  Worry too much - different things 0 1 3 0  Trouble relaxing 1 1 3  0  Restless 1 1 3  0  Easily annoyed or irritable 0 1 1 0  Afraid - awful might happen 0 2 3 0  Total GAD 7 Score 2 9 15  0  Anxiety Difficulty    Not difficult at all  Symptoms have improved. GAD-7 scores much improved. Will continue  current dose    No results found for any visits on 05/17/23.    The ASCVD Risk score (Arnett DK, et al., 2019) failed to calculate for the following reasons:   Cannot find a previous HDL lab    Assessment & Plan:   Problem List Items Addressed This Visit     GAD (generalized anxiety disorder)   Relevant Medications   buPROPion (WELLBUTRIN XL) 150 MG 24 hr tablet   Attention deficit hyperactivity disorder (ADHD)    GAD and ADHD symptoms have improved. GAD-7 score much improved. Will continue Wellbutrin XL 150 mg daily. Refill sent. Follow-up in 6 months, sooner if needed.       Relevant Medications   buPROPion (WELLBUTRIN XL) 150 MG 24 hr tablet  Agrees with plan of care discussed.  Questions answered.   Return in about 6 months (around 11/14/2023) for GAD/ADHD .    Novella Olive, FNP

## 2023-06-12 ENCOUNTER — Telehealth: Payer: Self-pay | Admitting: *Deleted

## 2023-06-12 NOTE — Telephone Encounter (Signed)
Left patient a message to call and schedule 3 month F/U with Dr. Penne Lash.

## 2023-07-08 ENCOUNTER — Encounter: Payer: Self-pay | Admitting: Family Medicine

## 2023-07-09 ENCOUNTER — Ambulatory Visit: Payer: No Typology Code available for payment source | Admitting: Family Medicine

## 2023-07-12 ENCOUNTER — Ambulatory Visit: Payer: No Typology Code available for payment source | Admitting: Family Medicine

## 2023-07-12 VITALS — BP 115/81 | HR 78 | Temp 98.1°F | Resp 18 | Ht 63.0 in | Wt 169.1 lb

## 2023-07-12 DIAGNOSIS — R197 Diarrhea, unspecified: Secondary | ICD-10-CM | POA: Insufficient documentation

## 2023-07-12 DIAGNOSIS — Z91030 Bee allergy status: Secondary | ICD-10-CM | POA: Insufficient documentation

## 2023-07-12 MED ORDER — EPINEPHRINE 0.3 MG/0.3ML IJ SOAJ
0.3000 mg | INTRAMUSCULAR | 1 refills | Status: AC | PRN
Start: 2023-07-12 — End: ?

## 2023-07-12 MED ORDER — AZITHROMYCIN 250 MG PO TABS
ORAL_TABLET | ORAL | 0 refills | Status: AC
Start: 2023-07-12 — End: 2023-07-17

## 2023-07-12 NOTE — Assessment & Plan Note (Signed)
Epi pen sent to pharmacy as safety precaution.

## 2023-07-12 NOTE — Progress Notes (Signed)
Established Patient Office Visit  Subjective   Patient ID: Sherry Zamora, female    DOB: 01-Nov-1975  Age: 47 y.o. MRN: [id number]  Chief Complaint  Patient presents with   GI Problem    Patient is here she states due to an ongoing stomach issue that is going around her house. She states that her stomach will be upset and rumbling followed by Diarrhea, she states that the issue did clear up for about 3 days but now its back again    HPI  Symptoms present in the home for several weeks. Daughter continues to have symptoms.  Friday-Sunday: symptoms improved, has returned.  Symptoms: diarrhea, nausea for one day.  3 stools yesterday. Does not feel bad. Denies abdominal pain. No fever. Some gurgling in abdomen.  Taking pro and pre biotic currently.  Keeping fluids down. No ongoing nausea.    Review of Systems  Gastrointestinal:  Positive for diarrhea. Negative for abdominal pain, nausea and vomiting.      Objective:     BP 115/81   Pulse 78   Temp 98.1 F (36.7 C) (Oral)   Resp 18   Ht 5\' 3"  (1.6 m)   Wt 169 lb 1.6 oz (76.7 kg)   SpO2 97%   BMI 29.95 kg/m  BP Readings from Last 3 Encounters:  07/12/23 115/81  05/17/23 105/70  04/30/23 134/85      Physical Exam Vitals and nursing note reviewed.  Constitutional:      General: She is not in acute distress.    Appearance: Normal appearance.  Cardiovascular:     Rate and Rhythm: Normal rate and regular rhythm.     Heart sounds: Normal heart sounds.  Pulmonary:     Effort: Pulmonary effort is normal.     Breath sounds: Normal breath sounds.  Abdominal:     General: Bowel sounds are normal.     Palpations: Abdomen is soft.     Tenderness: There is no abdominal tenderness.  Skin:    General: Skin is warm and dry.  Neurological:     General: No focal deficit present.     Mental Status: She is alert. Mental status is at baseline.  Psychiatric:        Mood and Affect: Mood normal.        Behavior: Behavior  normal.        Thought Content: Thought content normal.        Judgment: Judgment normal.     No results found for any visits on 07/12/23.    The ASCVD Risk score (Arnett DK, et al., 2019) failed to calculate for the following reasons:   Cannot find a previous HDL lab    Assessment & Plan:   Problem List Items Addressed This Visit     Diarrhea of presumed infectious origin - Primary   Diarrhea off and on for past 3 weeks. Symptoms present in the home. Keeping food and liquids down. No abdominal pain upon assessment today. Azithromycin 500 mg today, followed by 250 mg daily for next 4 days for presumed infectious source. Follow-up as needed.       Relevant Medications   azithromycin (ZITHROMAX) 250 MG tablet   Allergy to bee sting   Epi pen sent to pharmacy as safety precaution.       Relevant Medications   EPINEPHrine 0.3 mg/0.3 mL IJ SOAJ injection  Agrees with plan of care discussed.  Questions answered.   Return if symptoms worsen or fail  to improve.    Novella Olive, FNP

## 2023-07-12 NOTE — Assessment & Plan Note (Addendum)
Diarrhea off and on for past 3 weeks. Symptoms present in the home. Keeping food and liquids down. No abdominal pain upon assessment today. Azithromycin 500 mg today, followed by 250 mg daily for next 4 days for presumed infectious source. Follow-up as needed.

## 2023-07-17 ENCOUNTER — Encounter: Payer: Self-pay | Admitting: Family Medicine

## 2023-08-16 ENCOUNTER — Encounter: Payer: Self-pay | Admitting: Family Medicine

## 2023-08-16 ENCOUNTER — Ambulatory Visit: Payer: No Typology Code available for payment source | Admitting: Family Medicine

## 2023-08-16 DIAGNOSIS — G43119 Migraine with aura, intractable, without status migrainosus: Secondary | ICD-10-CM

## 2023-08-16 MED ORDER — KETOROLAC TROMETHAMINE 30 MG/ML IJ SOLN
30.0000 mg | Freq: Once | INTRAMUSCULAR | Status: AC
  Administered 2023-08-16: 30 mg via INTRAMUSCULAR

## 2023-08-16 MED ORDER — DEXAMETHASONE SODIUM PHOSPHATE 10 MG/ML IJ SOLN
10.0000 mg | Freq: Once | INTRAMUSCULAR | Status: AC
  Administered 2023-08-16: 10 mg via INTRAMUSCULAR

## 2023-08-16 MED ORDER — SUMATRIPTAN SUCCINATE 100 MG PO TABS: ORAL_TABLET | ORAL | 1 refills | Status: AC

## 2023-08-16 NOTE — Assessment & Plan Note (Signed)
Ongoing migraine once or twice daily for past 10 days. Takes sumatriptan and Ajovy which are usually effective treatments for migraine. Reports steroid  injection will usually break the cycle. Dexamethasone 10 mg and Toradol 30 mg IM given today. Has appointment with neuro on 2/7 to discuss treatment plan. No red flags. No headache currently, no photophobia,  no nausea.  Follow-up as needed.

## 2023-08-16 NOTE — Progress Notes (Signed)
Acute Office Visit  Subjective:     Patient ID: Sherry Zamora, female    DOB: 18-Nov-1975, 48 y.o.   MRN: [id number]  Chief Complaint  Patient presents with   Migraine    Patient states that  having increase in migraines in last 10 days was having migraine daily once but now  having twice daily migraine headaches for the past 4 days.     HPI Patient is in today for ongoing migraines daily for last 10 days. Using sumatriptan and Ajovy as prescribed.  Ajovy has kept it under control until recently.  In the past, steroid injection has helped to break the headache cycle.  Has neuro appointment on 08/24/23 to discuss treatment plan.  Tried 2 days of oral prednisone which did not help.  Denies nausea, photophobia. Denies recent illness that could have triggered headaches. No headache currently in the office.  Needs refill of sumatriptan today.   Review of Systems  Constitutional:  Negative for chills and fever.  Neurological:  Positive for headaches.        Objective:    BP 106/76 (BP Location: Right Arm, Patient Position: Sitting, Cuff Size: Normal)   Pulse 76   Temp 98.3 F (36.8 C)   Ht 5\' 3"  (1.6 m)   Wt 167 lb (75.8 kg)   SpO2 100%   BMI 29.58 kg/m  BP Readings from Last 3 Encounters:  08/16/23 106/76  07/12/23 115/81  05/17/23 105/70      Physical Exam Vitals and nursing note reviewed.  Constitutional:      General: She is not in acute distress.    Appearance: Normal appearance.  Cardiovascular:     Rate and Rhythm: Regular rhythm.     Heart sounds: Normal heart sounds.  Pulmonary:     Effort: Pulmonary effort is normal.     Breath sounds: Normal breath sounds.  Skin:    General: Skin is warm and dry.  Neurological:     General: No focal deficit present.     Mental Status: She is alert. Mental status is at baseline.     GCS: GCS eye subscore is 4. GCS verbal subscore is 5. GCS motor subscore is 6.     Cranial Nerves: No facial asymmetry.     Motor:  Motor function is intact.     Coordination: Coordination is intact.     Gait: Gait is intact.  Psychiatric:        Mood and Affect: Mood normal.        Behavior: Behavior normal.        Thought Content: Thought content normal.        Judgment: Judgment normal.    No results found for any visits on 08/16/23.      Assessment & Plan:   Problem List Items Addressed This Visit     Intractable migraine with aura without status migrainosus   Ongoing migraine once or twice daily for past 10 days. Takes sumatriptan and Ajovy which are usually effective treatments for migraine. Reports steroid  injection will usually break the cycle. Dexamethasone 10 mg and Toradol 30 mg IM given today. Has appointment with neuro on 2/7 to discuss treatment plan. No red flags. No headache currently, no photophobia,  no nausea.  Follow-up as needed.       Relevant Medications   SUMAtriptan (IMITREX) 100 MG tablet   dexamethasone (DECADRON) injection 10 mg   ketorolac (TORADOL) 30 MG/ML injection 30 mg  Meds ordered this encounter  Medications   SUMAtriptan (IMITREX) 100 MG tablet    Sig: TAKE ONE TABLET BY MOUTH EVERY TWO HOURS AS NEEDED FOR MIGRAINE    Dispense:  10 tablet    Refill:  1   dexamethasone (DECADRON) injection 10 mg   ketorolac (TORADOL) 30 MG/ML injection 30 mg  Agrees with plan of care discussed.  Questions answered.   Return if symptoms worsen or fail to improve.  Novella Olive, FNP

## 2023-09-05 ENCOUNTER — Ambulatory Visit (INDEPENDENT_AMBULATORY_CARE_PROVIDER_SITE_OTHER): Payer: No Typology Code available for payment source | Admitting: Family Medicine

## 2023-09-05 ENCOUNTER — Encounter: Payer: Self-pay | Admitting: Family Medicine

## 2023-09-05 VITALS — BP 105/71 | HR 83 | Temp 97.6°F | Resp 18 | Ht 63.0 in | Wt 165.2 lb

## 2023-09-05 DIAGNOSIS — G43119 Migraine with aura, intractable, without status migrainosus: Secondary | ICD-10-CM | POA: Diagnosis not present

## 2023-09-05 DIAGNOSIS — R251 Tremor, unspecified: Secondary | ICD-10-CM | POA: Diagnosis not present

## 2023-09-05 MED ORDER — KETOROLAC TROMETHAMINE 30 MG/ML IJ SOLN
30.0000 mg | Freq: Once | INTRAMUSCULAR | Status: AC
Start: 2023-09-05 — End: 2023-09-05
  Administered 2023-09-05: 30 mg via INTRAMUSCULAR

## 2023-09-05 MED ORDER — DEXAMETHASONE SODIUM PHOSPHATE 10 MG/ML IJ SOLN
10.0000 mg | Freq: Once | INTRAMUSCULAR | Status: AC
Start: 2023-09-05 — End: 2023-09-05
  Administered 2023-09-05: 10 mg via INTRAMUSCULAR

## 2023-09-05 NOTE — Progress Notes (Signed)
 Acute Office Visit  Subjective:     Patient ID: Sherry Zamora, female    DOB: 1976/05/24, 48 y.o.   MRN: [id number]  Chief Complaint  Patient presents with   Migraine    Patient states that since last Monday or Tuesday she has woken up to a headache or migraine, She states that she was prescribed a nasal spray (Zavzpret 10 mg) she states that it does not work. She states that only thing that did work was a shot that she received here on 08/16/2023 (  Dexamethasone 10 mg and Toradol 30 mg )and that it helped for about 5 days without a headache.     HPI Patient is in today for persistent migraine since Monday or Tuesday. Wakes her up from sleep. Took sumatriptan 100 mg this morning with some relief, currently dull pain.  Last took Ajovy on 08/26/23.  Received toradol 30 mg and dexamethasone 10 mg IM on 08/16/23.  This treatment was effective.   Reports tremor in right hand that occurs randomly. She is able to stop it by holding hand.  Wants a new referral for neurology for headaches and tremor.    Chart review:  Saw neurology on 08/24/23. Nasal spray Zavegepant 10 mg/ nasal spray.  Botox ordered need PA.   Review of Systems  Eyes:  Negative for blurred vision, double vision and photophobia.  Gastrointestinal:  Negative for nausea and vomiting.  Neurological:  Positive for headaches.        Objective:    BP 105/71   Pulse 83   Temp 97.6 F (36.4 C) (Oral)   Resp 18   Ht 5\' 3"  (1.6 m)   Wt 165 lb 3.2 oz (74.9 kg)   SpO2 99%   BMI 29.26 kg/m  BP Readings from Last 3 Encounters:  09/05/23 105/71  08/16/23 106/76  07/12/23 115/81      Physical Exam Vitals and nursing note reviewed.  Constitutional:      General: She is not in acute distress.    Appearance: Normal appearance.  Cardiovascular:     Rate and Rhythm: Regular rhythm.  Pulmonary:     Effort: Pulmonary effort is normal.  Skin:    General: Skin is warm and dry.  Neurological:     General: No focal  deficit present.     Mental Status: She is alert and oriented to person, place, and time. Mental status is at baseline.     GCS: GCS eye subscore is 4. GCS verbal subscore is 5. GCS motor subscore is 6.     Cranial Nerves: Cranial nerves 2-12 are intact. No facial asymmetry.     Sensory: Sensation is intact.     Motor: Motor function is intact.     Coordination: Coordination is intact. Romberg sign negative. Coordination normal. Finger-Nose-Finger Test and Heel to [facility] Test normal.     Gait: Gait is intact. Gait normal.  Psychiatric:        Mood and Affect: Mood normal.        Behavior: Behavior normal.        Thought Content: Thought content normal.        Judgment: Judgment normal.    No results found for any visits on 09/05/23.      Assessment & Plan:   Problem List Items Addressed This Visit     Intractable migraine with aura without status migrainosus - Primary   Persistent headache since Monday. Took sumatriptan 100 mg this morning which  has helped to calm the pain. Describes pain as dull currently.  No nausea. No photophobia. Neurologically intact in the office today.  Saw neuro on 08/24/23:  Prescribed Zavegepant 10 mg nasal spray which has not been effective.  Botox ordered, needs to go through PA. 08/16/23: received Toradol 30 mg and dexamethasone 10 mg IM x 1 which helped to control symptoms for 5 days.  Will repeat today.  Referral placed for neurology today for second opinion  for presisitent migraine.       Relevant Orders   Ambulatory referral to Neurology   Tremor of right hand   Reports random tremor in right hand. Neurologically intact today. Neurology referral placed.       Relevant Orders   Ambulatory referral to Neurology    Meds ordered this encounter  Medications   dexamethasone (DECADRON) injection 10 mg   ketorolac (TORADOL) 30 MG/ML injection 30 mg  Agrees with plan of care discussed.  Questions answered.   Return if symptoms worsen or  fail to improve.  Novella Olive, FNP

## 2023-09-05 NOTE — Assessment & Plan Note (Addendum)
 Persistent headache since Monday. Took sumatriptan 100 mg this morning which has helped to calm the pain. Describes pain as dull currently.  No nausea. No photophobia. Neurologically intact in the office today.  Saw neuro on 08/24/23:  Prescribed Zavegepant 10 mg nasal spray which has not been effective.  Botox ordered, needs to go through PA. 08/16/23: received Toradol 30 mg and dexamethasone 10 mg IM x 1 which helped to control symptoms for 5 days.  Will repeat today.  Referral placed for neurology today for second opinion  for presisitent migraine.

## 2023-09-05 NOTE — Assessment & Plan Note (Signed)
 Reports random tremor in right hand. Neurologically intact today. Neurology referral placed.

## 2023-09-12 ENCOUNTER — Encounter: Payer: Self-pay | Admitting: Family Medicine

## 2023-09-13 ENCOUNTER — Encounter: Payer: Self-pay | Admitting: Family Medicine

## 2023-11-14 ENCOUNTER — Ambulatory Visit: Payer: No Typology Code available for payment source | Admitting: Family Medicine

## 2023-11-14 ENCOUNTER — Encounter: Payer: Self-pay | Admitting: Family Medicine

## 2023-11-14 DIAGNOSIS — F411 Generalized anxiety disorder: Secondary | ICD-10-CM | POA: Diagnosis not present

## 2023-11-14 DIAGNOSIS — F909 Attention-deficit hyperactivity disorder, unspecified type: Secondary | ICD-10-CM | POA: Diagnosis not present

## 2023-11-14 MED ORDER — BUPROPION HCL ER (XL) 150 MG PO TB24: 150.0000 mg | ORAL_TABLET | Freq: Every day | ORAL | 1 refills | Status: AC

## 2023-11-14 NOTE — Progress Notes (Signed)
 Established Patient Office Visit  Subjective   Patient ID: Sherry Zamora, female    DOB: [birth year]  Age: 48 y.o. MRN: [id number]  No chief complaint on file.   HPI  Medication compliance: Taking Wellbutrin  XL 150 mg daily as prescribed.  Symptoms well controlled.  Denies thoughts of suicide, insomnia, poor appetite.  Tolerating medication: no side effects  Continue current regimen: no changes in current regimen. Follow-up: 6 months   Migraine: being managed better. Has started on Botox injections. Continuing with Ajovy .  Seeing NP with [facility] for treatments.      ROS    Objective:     BP 100/64 (BP Location: Left Arm, Patient Position: Sitting, Cuff Size: Large)   Pulse 91   Temp 97.6 F (36.4 C) (Oral)   Resp 18   Ht 5\' 3"  (1.6 m)   Wt 151 lb (68.5 kg)   SpO2 99%   BMI 26.75 kg/m  BP Readings from Last 3 Encounters:  11/14/23 100/64  09/05/23 105/71  08/16/23 106/76      Physical Exam Vitals and nursing note reviewed.  Constitutional:      General: She is not in acute distress.    Appearance: Normal appearance.  Cardiovascular:     Rate and Rhythm: Normal rate and regular rhythm.     Heart sounds: Normal heart sounds.  Pulmonary:     Effort: Pulmonary effort is normal.     Breath sounds: Normal breath sounds.  Skin:    General: Skin is warm and dry.  Neurological:     General: No focal deficit present.     Mental Status: She is alert. Mental status is at baseline.  Psychiatric:        Mood and Affect: Mood normal.        Behavior: Behavior normal.        Thought Content: Thought content normal.        Judgment: Judgment normal.       11/14/2023    4:24 PM 04/30/2023    4:26 PM 01/22/2023    8:32 AM 10/18/2022    1:48 PM  Depression screen PHQ 2/9  Decreased Interest 0 1 0 0  Down, Depressed, Hopeless 0 0 0 0  PHQ - 2 Score 0 1 0 0  Altered sleeping 2 2  1   Tired, decreased energy 0 1  1  Change in appetite 0 0  0  Feeling bad or  failure about yourself  0 0  0  Trouble concentrating 2 2  2   Moving slowly or fidgety/restless 1 0  0  Suicidal thoughts 0 0  0  PHQ-9 Score 5 6  4   Difficult doing work/chores Somewhat difficult Somewhat difficult  Somewhat difficult  -     11/14/2023    4:24 PM 05/17/2023    4:16 PM 04/30/2023    4:28 PM 04/10/2023    4:33 PM  GAD 7 : Generalized Anxiety Score  Nervous, Anxious, on Edge 0 0 2 1  Control/stop worrying 0 0 1 1  Worry too much - different things 0 0 1 3  Trouble relaxing 1 1 1 3   Restless 1 1 1 3   Easily annoyed or irritable 0 0 1 1  Afraid - awful might happen 1 0 2 3  Total GAD 7 Score 3 2 9 15   Anxiety Difficulty Not difficult at all        No results found for any visits on 11/14/23.  The ASCVD Risk score (Arnett DK, et al., 2019) failed to calculate for the following reasons:   Cannot find a previous HDL lab    Assessment & Plan:   Problem List Items Addressed This Visit     GAD (generalized anxiety disorder)   Taking Wellbutrin  XL 150 mg daily as prescribed. Symptoms are well controlled. No thoughts of self harm. Sleep and appetite are normal.  GAD-7 score: 3 today Refill sent. Follow-up in 6 months.       Relevant Medications   buPROPion  (WELLBUTRIN  XL) 150 MG 24 hr tablet   Attention deficit hyperactivity disorder (ADHD)   Relevant Medications   buPROPion  (WELLBUTRIN  XL) 150 MG 24 hr tablet  Agrees with plan of care discussed.  Questions answered.   Return in about 6 months (around 05/15/2024) for GAD/ADHD .    Mickiel Albany, FNP

## 2023-11-14 NOTE — Assessment & Plan Note (Addendum)
 Taking Wellbutrin  XL 150 mg daily as prescribed. Symptoms are well controlled. No thoughts of self harm. Sleep and appetite are normal.  GAD-7 score: 3 today Refill sent. Follow-up in 6 months.

## 2023-11-27 ENCOUNTER — Other Ambulatory Visit: Payer: Self-pay | Admitting: Family Medicine

## 2023-11-27 ENCOUNTER — Encounter: Payer: Self-pay | Admitting: Family Medicine

## 2023-11-27 DIAGNOSIS — R031 Nonspecific low blood-pressure reading: Secondary | ICD-10-CM | POA: Insufficient documentation

## 2023-12-01 LAB — CORTISOL: Cortisol: 12.5 ug/dL (ref 6.2–19.4)

## 2023-12-02 ENCOUNTER — Ambulatory Visit: Payer: Self-pay | Admitting: Family Medicine

## 2024-01-21 ENCOUNTER — Other Ambulatory Visit: Payer: Self-pay | Admitting: Family Medicine

## 2024-01-21 DIAGNOSIS — G43119 Migraine with aura, intractable, without status migrainosus: Secondary | ICD-10-CM

## 2024-03-28 ENCOUNTER — Other Ambulatory Visit: Payer: Self-pay | Admitting: Medical Genetics

## 2024-04-04 ENCOUNTER — Encounter: Payer: Self-pay | Admitting: Family Medicine

## 2024-04-04 ENCOUNTER — Ambulatory Visit (INDEPENDENT_AMBULATORY_CARE_PROVIDER_SITE_OTHER): Admitting: Family Medicine

## 2024-04-04 VITALS — BP 93/62 | HR 100 | Temp 97.8°F | Ht 63.0 in | Wt 130.0 lb

## 2024-04-04 DIAGNOSIS — Z0000 Encounter for general adult medical examination without abnormal findings: Secondary | ICD-10-CM

## 2024-04-04 DIAGNOSIS — M2550 Pain in unspecified joint: Secondary | ICD-10-CM

## 2024-04-04 DIAGNOSIS — Z13220 Encounter for screening for lipoid disorders: Secondary | ICD-10-CM | POA: Diagnosis not present

## 2024-04-04 DIAGNOSIS — R5382 Chronic fatigue, unspecified: Secondary | ICD-10-CM

## 2024-04-04 DIAGNOSIS — Z13228 Encounter for screening for other metabolic disorders: Secondary | ICD-10-CM

## 2024-04-04 DIAGNOSIS — Z1159 Encounter for screening for other viral diseases: Secondary | ICD-10-CM | POA: Insufficient documentation

## 2024-04-04 DIAGNOSIS — Z136 Encounter for screening for cardiovascular disorders: Secondary | ICD-10-CM

## 2024-04-04 DIAGNOSIS — Z130 Encounter for screening for diseases of the blood and blood-forming organs and certain disorders involving the immune mechanism: Secondary | ICD-10-CM

## 2024-04-04 NOTE — Progress Notes (Signed)
 Complete physical exam  Patient: Sherry Zamora   DOB: Jun 25, 1976   48 y.o. Female  MRN: [id number]  Subjective:    Chief Complaint  Patient presents with   Annual Exam    With labs    Sherry Zamora is a 48 y.o. female who presents today for a complete physical exam. She reports consuming a low carbs, high protein and vegetables.  diet. Home exercise routine includes weight training. . She generally feels well. She reports sleeping poorly. She does not have additional problems to discuss today.    Most recent fall risk assessment:    04/04/2024    9:33 AM  Fall Risk   Falls in the past year? 0  Number falls in past yr: 0  Injury with Fall? 0  Risk for fall due to : No Fall Risks  Follow up Falls evaluation completed     Most recent depression screenings:    04/04/2024    9:33 AM 11/14/2023    4:24 PM  PHQ 2/9 Scores  PHQ - 2 Score 0 0  PHQ- 9 Score 3 5    Vision:Within last year and Dental: No current dental problems and Receives regular dental care    Patient Care Team: Booker Darice SAUNDERS, FNP as PCP - General (Family Medicine)   Outpatient Medications Prior to Visit  Medication Sig   buPROPion  (WELLBUTRIN  XL) 150 MG 24 hr tablet Take 1 tablet (150 mg total) by mouth daily.   cetirizine (ZYRTEC) 10 MG tablet Take 10 mg by mouth daily.   EPINEPHrine  0.3 mg/0.3 mL IJ SOAJ injection Inject 0.3 mg into the muscle as needed for anaphylaxis.   Fremanezumab -vfrm (AJOVY ) 225 MG/1.5ML SOAJ INJECT 225 MG INTO THE SKIN EVERY 30 (THIRTY) DAYS. INJECT INTO THE SKIN.   SUMAtriptan  (IMITREX ) 100 MG tablet TAKE ONE TABLET BY MOUTH EVERY TWO HOURS AS NEEDED FOR MIGRAINE   tirzepatide (MOUNJARO) 10 MG/0.5ML Pen Inject 10 mg into the skin once a week.   vitamin E 45 MG (100 UNITS) capsule Take by mouth daily.   Multiple Vitamin (MULTIVITAMIN) tablet Take 1 tablet by mouth daily. (Patient not taking: Reported on 04/04/2024)   triamcinolone ointment (KENALOG) 0.1 % Apply topically 3  (three) times daily. (Patient not taking: Reported on 04/04/2024)   No facility-administered medications prior to visit.    ROS        Objective:     BP 93/62   Pulse 100   Temp 97.8 F (36.6 C) (Oral)   Ht 5' 3 (1.6 m)   Wt 130 lb (59 kg)   LMP 01/26/2023   SpO2 100%   BMI 23.03 kg/m    Physical Exam Vitals and nursing note reviewed.  Constitutional:      General: She is not in acute distress.    Appearance: Normal appearance.  HENT:     Right Ear: Tympanic membrane normal.     Left Ear: Tympanic membrane normal.     Nose: Nose normal.     Mouth/Throat:     Mouth: Mucous membranes are moist.     Pharynx: Oropharynx is clear.  Eyes:     Extraocular Movements: Extraocular movements intact.  Neck:     Thyroid: No thyroid tenderness.  Cardiovascular:     Rate and Rhythm: Normal rate and regular rhythm.     Pulses:          Radial pulses are 2+ on the right side and 2+ on the left side.  Heart sounds: Normal heart sounds, S1 normal and S2 normal.  Pulmonary:     Effort: Pulmonary effort is normal.     Breath sounds: Normal breath sounds.  Abdominal:     General: Bowel sounds are normal.     Palpations: Abdomen is soft.     Tenderness: There is no abdominal tenderness.  Musculoskeletal:        General: Normal range of motion.     Cervical back: Normal range of motion.     Right lower leg: No edema.     Left lower leg: No edema.  Lymphadenopathy:     Cervical:     Right cervical: No superficial cervical adenopathy.    Left cervical: No superficial cervical adenopathy.  Skin:    General: Skin is warm and dry.  Neurological:     General: No focal deficit present.     Mental Status: She is alert. Mental status is at baseline.  Psychiatric:        Mood and Affect: Mood normal.        Behavior: Behavior normal.        Thought Content: Thought content normal.        Judgment: Judgment normal.      No results found for any visits on 04/04/24.      Assessment & Plan:    Routine Health Maintenance and Physical Exam  Immunization History  Administered Date(s) Administered   INFLUENZA, HIGH DOSE SEASONAL PF 05/07/2013   Influenza Whole 05/27/2008, 04/14/2009   Influenza, Quadrivalent, Recombinant, Inj, Pf 05/02/2016, 03/18/2017, 03/19/2018   Influenza,inj,Quad PF,6+ Mos 05/07/2013   PFIZER(Purple Top)SARS-COV-2 Vaccination 09/19/2019, 10/17/2019   Tdap 06/15/2014, 03/17/2023    Health Maintenance  Topic Date Due   HIV Screening  Never done   Hepatitis C Screening  Never done   Hepatitis B Vaccines 19-59 Average Risk (1 of 3 - 19+ 3-dose series) Never done   Mammogram  Never done   Colonoscopy  Never done   Influenza Vaccine  02/15/2024   Cervical Cancer Screening (HPV/Pap Cotest)  04/01/2028   DTaP/Tdap/Td (3 - Td or Tdap) 03/16/2033   Pneumococcal Vaccine  Aged Out   HPV VACCINES  Aged Out   Meningococcal B Vaccine  Aged Out   COVID-19 Vaccine  Discontinued    Discussed health benefits of physical activity, and encouraged her to engage in regular exercise appropriate for her age and condition.  Annual physical exam  Encounter for lipid screening for cardiovascular disease -     Lipid panel  Encounter for screening for metabolic disorder -     Comprehensive metabolic panel with GFR -     Hemoglobin A1c  Screening for viral disease -     Hepatitis C antibody -     HIV Antibody (routine testing w rflx) -     TSH+T4F+T3Free  Screening for deficiency anemia -     CBC  Chronic fatigue -     B12 and Folate Panel -     VITAMIN D  25 Hydroxy (Vit-D Deficiency, Fractures)  Arthralgia, unspecified joint -     ANA      Routine labs ordered.  HCM reviewed/discussed. Declines flu vaccine. Anticipatory guidance regarding healthy weight, lifestyle and choices given. Recommend healthy diet.  Recommend approximately 150 minutes/week of moderate intensity exercise. Resistance training is good for building muscles and  for bone health. Muscle mass helps to increase our metabolism and to burn more calories at rest.  Limit alcohol consumption: no  more than one drink per day for women and 2 drinks per day for me. Recommend regular dental and vision exams. Always use seatbelt/lap and shoulder restraints. Recommend using smoke alarms and checking batteries at least twice a year. Recommend using sunscreen when outside. Agrees with plan of care discussed.  Questions answered.      Return in about 1 year (around 04/06/2025) for CPE with labs.     Darice JONELLE Brownie, FNP

## 2024-04-07 LAB — ENA+DNA/DS+ANTICH+CENTRO+FA...
Anti JO-1: <0.2 AI (ref 0.0–0.9)
Centromere Ab Screen: <0.2 AI (ref 0.0–0.9)
Chromatin Ab SerPl-aCnc: <0.2 AI (ref 0.0–0.9)
ENA RNP Ab: <0.2 AI (ref 0.0–0.9)
ENA SM Ab Ser-aCnc: <0.2 AI (ref 0.0–0.9)
ENA SSA (RO) Ab: <0.2 AI (ref 0.0–0.9)
ENA SSB (LA) Ab: <0.2 AI (ref 0.0–0.9)
Scleroderma (Scl-70) (ENA) Antibody, IgG: <0.2 AI (ref 0.0–0.9)
Speckled Pattern: 1:80 {titer}
dsDNA Ab: <1 [IU]/mL (ref 0–9)

## 2024-04-07 LAB — HEPATITIS C ANTIBODY: Hep C Virus Ab: NONREACTIVE

## 2024-04-07 LAB — CBC
Hematocrit: 44.3 % (ref 34.0–46.6)
Hemoglobin: 14.1 g/dL (ref 11.1–15.9)
MCH: 30.3 pg (ref 26.6–33.0)
MCHC: 31.8 g/dL (ref 31.5–35.7)
MCV: 95 fL (ref 79–97)
Platelets: 268 x10E3/uL (ref 150–450)
RBC: 4.66 x10E6/uL (ref 3.77–5.28)
RDW: 12 % (ref 11.7–15.4)
WBC: 5 x10E3/uL (ref 3.4–10.8)

## 2024-04-07 LAB — HEMOGLOBIN A1C
Est. average glucose Bld gHb Est-mCnc: 97 mg/dL
Hgb A1c MFr Bld: 5 % (ref 4.8–5.6)

## 2024-04-07 LAB — LIPID PANEL
Chol/HDL Ratio: 3 ratio (ref 0.0–4.4)
Cholesterol, Total: 234 mg/dL — ABNORMAL HIGH (ref 100–199)
HDL: 79 mg/dL (ref >39)
LDL Chol Calc (NIH): 145 mg/dL — ABNORMAL HIGH (ref 0–99)
Triglycerides: 57 mg/dL (ref 0–149)
VLDL Cholesterol Cal: 10 mg/dL (ref 5–40)

## 2024-04-07 LAB — COMPREHENSIVE METABOLIC PANEL WITH GFR
ALT: 13 IU/L (ref 0–32)
AST: 15 IU/L (ref 0–40)
Albumin: 4.9 g/dL (ref 3.9–4.9)
Alkaline Phosphatase: 54 IU/L (ref 41–116)
BUN/Creatinine Ratio: 16 (ref 9–23)
BUN: 11 mg/dL (ref 6–24)
Bilirubin Total: 0.4 mg/dL (ref 0.0–1.2)
CO2: 22 mmol/L (ref 20–29)
Calcium: 10.1 mg/dL (ref 8.7–10.2)
Chloride: 102 mmol/L (ref 96–106)
Creatinine, Ser: 0.69 mg/dL (ref 0.57–1.00)
Globulin, Total: 2.4 g/dL (ref 1.5–4.5)
Glucose: 84 mg/dL (ref 70–99)
Potassium: 4.3 mmol/L (ref 3.5–5.2)
Sodium: 139 mmol/L (ref 134–144)
Total Protein: 7.3 g/dL (ref 6.0–8.5)
eGFR: 107 mL/min/1.73 (ref >59)

## 2024-04-07 LAB — TSH+T4F+T3FREE
Free T4: 1.35 ng/dL (ref 0.82–1.77)
T3, Free: 2.9 pg/mL (ref 2.0–4.4)
TSH: 1.44 u[IU]/mL (ref 0.450–4.500)

## 2024-04-07 LAB — HIV ANTIBODY (ROUTINE TESTING W REFLEX): HIV Screen 4th Generation wRfx: NONREACTIVE

## 2024-04-07 LAB — B12 AND FOLATE PANEL
Folate: 18.2 ng/mL (ref >3.0)
Vitamin B-12: 458 pg/mL (ref 232–1245)

## 2024-04-07 LAB — ANA: ANA Titer 1: POSITIVE — AB

## 2024-04-07 LAB — VITAMIN D 25 HYDROXY (VIT D DEFICIENCY, FRACTURES): Vit D, 25-Hydroxy: 43.9 ng/mL (ref 30.0–100.0)

## 2024-04-08 ENCOUNTER — Ambulatory Visit: Payer: Self-pay | Admitting: Family Medicine

## 2024-04-08 DIAGNOSIS — R768 Other specified abnormal immunological findings in serum: Secondary | ICD-10-CM | POA: Insufficient documentation

## 2024-04-24 ENCOUNTER — Other Ambulatory Visit: Payer: Self-pay | Admitting: Family Medicine

## 2024-04-24 DIAGNOSIS — G43119 Migraine with aura, intractable, without status migrainosus: Secondary | ICD-10-CM

## 2024-05-01 ENCOUNTER — Other Ambulatory Visit: Payer: Self-pay | Admitting: Family Medicine

## 2024-05-01 DIAGNOSIS — G43119 Migraine with aura, intractable, without status migrainosus: Secondary | ICD-10-CM

## 2024-05-01 MED ORDER — AJOVY 225 MG/1.5ML ~~LOC~~ SOAJ: 225.0000 mg | SUBCUTANEOUS | 6 refills | Status: AC
